# Patient Record
Sex: Male | Born: 1970 | Race: White | Hispanic: No | Marital: Married | Smoking: Never smoker
Health system: Southern US, Community
[De-identification: ages and names within clinical notes are randomized; demographics above are authoritative.]

## PROBLEM LIST (undated history)

## (undated) DIAGNOSIS — M109 Gout, unspecified: Secondary | ICD-10-CM

## (undated) DIAGNOSIS — I10 Essential (primary) hypertension: Secondary | ICD-10-CM

## (undated) DIAGNOSIS — Z87820 Personal history of traumatic brain injury: Secondary | ICD-10-CM

## (undated) HISTORY — PX: CARPAL TUNNEL RELEASE: SHX101

## (undated) HISTORY — PX: GASTRIC BYPASS: SHX52

## (undated) HISTORY — DX: Personal history of traumatic brain injury: Z87.820

## (undated) HISTORY — PX: BACK SURGERY: SHX140

## (undated) HISTORY — PX: KNEE SURGERY: SHX244

---

## 2003-08-20 ENCOUNTER — Encounter: Admission: RE | Admit: 2003-08-20 | Discharge: 2003-08-20 | Payer: Self-pay | Admitting: Family Medicine

## 2003-08-21 ENCOUNTER — Encounter: Admission: RE | Admit: 2003-08-21 | Discharge: 2003-08-21 | Payer: Self-pay | Admitting: Family Medicine

## 2003-12-05 ENCOUNTER — Emergency Department (HOSPITAL_COMMUNITY): Admission: EM | Admit: 2003-12-05 | Discharge: 2003-12-05 | Payer: Self-pay | Admitting: Emergency Medicine

## 2004-05-30 ENCOUNTER — Encounter: Admission: RE | Admit: 2004-05-30 | Discharge: 2004-05-30 | Payer: Self-pay | Admitting: Orthopedic Surgery

## 2013-12-02 ENCOUNTER — Ambulatory Visit: Payer: BC Managed Care – PPO | Admitting: Neurology

## 2014-01-04 ENCOUNTER — Emergency Department (HOSPITAL_COMMUNITY): Payer: BC Managed Care – PPO

## 2014-01-04 ENCOUNTER — Emergency Department (HOSPITAL_COMMUNITY)
Admission: EM | Admit: 2014-01-04 | Discharge: 2014-01-04 | Disposition: A | Discharge status: Home / Self Care | Payer: BC Managed Care – PPO | Attending: Emergency Medicine | Admitting: Emergency Medicine

## 2014-01-04 ENCOUNTER — Encounter (HOSPITAL_COMMUNITY): Payer: Self-pay | Admitting: Emergency Medicine

## 2014-01-04 DIAGNOSIS — I10 Essential (primary) hypertension: Secondary | ICD-10-CM | POA: Insufficient documentation

## 2014-01-04 DIAGNOSIS — F0781 Postconcussional syndrome: Secondary | ICD-10-CM | POA: Insufficient documentation

## 2014-01-04 DIAGNOSIS — Z862 Personal history of diseases of the blood and blood-forming organs and certain disorders involving the immune mechanism: Secondary | ICD-10-CM | POA: Insufficient documentation

## 2014-01-04 DIAGNOSIS — Z8639 Personal history of other endocrine, nutritional and metabolic disease: Secondary | ICD-10-CM | POA: Insufficient documentation

## 2014-01-04 HISTORY — DX: Essential (primary) hypertension: I10

## 2014-01-04 HISTORY — DX: Gout, unspecified: M10.9

## 2014-01-04 MED ORDER — GADOBENATE DIMEGLUMINE 529 MG/ML IV SOLN
20.0000 mL | Freq: Once | INTRAVENOUS | Status: AC | PRN
  Administered 2014-01-04: 20 mL via INTRAVENOUS

## 2014-01-04 MED ORDER — ONDANSETRON HCL 4 MG/2ML IJ SOLN
4.0000 mg | Freq: Once | INTRAMUSCULAR | Status: AC
  Administered 2014-01-04: 4 mg via INTRAVENOUS
  Filled 2014-01-04: qty 2

## 2014-01-04 NOTE — ED Provider Notes (Signed)
CSN: [id number]     Arrival date & time 01/04/14  1146 History   First MD Initiated Contact with Patient 01/04/14 1200     Chief Complaint  Patient presents with  . neurological changes      (Consider location/radiation/quality/duration/timing/severity/associated sxs/prior Treatment) The history is provided by the patient and the spouse.   Pt here with intermittent headaches and trouble speaking since a tbi 8 weeks ago when he was struck in the head with a tree branch--head ct was neg per pt--since then, he has had periods of confusion and ataxia--no syncope or focal deficits--sx persistent and nothing makes them better or worse, no tx used pta--some emesis today without abd pain Past Medical History  Diagnosis Date  . Hypertension   . Gout    Past Surgical History  Procedure Laterality Date  . Gastric bypass    . Back surgery    . Carpal tunnel release    . Knee surgery     Family History  Problem Relation Age of Onset  . Emphysema Father    History  Substance Use Topics  . Smoking status: Never Smoker   . Smokeless tobacco: Never Used  . Alcohol Use: Yes     Comment: socially    Review of Systems  All other systems reviewed and are negative.     Allergies  Review of patient's allergies indicates no known allergies.  Home Medications   Prior to Admission medications   Not on File   BP 160/99  Pulse 100  Temp(Src) 98.1 F (36.7 C) (Oral)  Resp 21  Ht 6\' 6"  (1.981 m)  Wt 207 lb (93.895 kg)  BMI 23.93 kg/m2  SpO2 95% Physical Exam  Nursing note and vitals reviewed. Constitutional: He is oriented to person, place, and time. He appears well-developed and well-nourished.  Non-toxic appearance. No distress.  HENT:  Head: Normocephalic and atraumatic.  Eyes: Conjunctivae, EOM and lids are normal. Pupils are equal, round, and reactive to light.  Neck: Normal range of motion. Neck supple. No tracheal deviation present. No mass present.  Cardiovascular: Normal  rate, regular rhythm and normal heart sounds.  Exam reveals no gallop.   No murmur heard. Pulmonary/Chest: Effort normal and breath sounds normal. No stridor. No respiratory distress. He has no decreased breath sounds. He has no wheezes. He has no rhonchi. He has no rales.  Abdominal: Soft. Normal appearance and bowel sounds are normal. He exhibits no distension. There is no tenderness. There is no rebound and no CVA tenderness.  Musculoskeletal: Normal range of motion. He exhibits no edema and no tenderness.  Neurological: He is alert and oriented to person, place, and time. He has normal strength. No cranial nerve deficit or sensory deficit. GCS eye subscore is 4. GCS verbal subscore is 5. GCS motor subscore is 6.  Skin: Skin is warm and dry. No abrasion and no rash noted.  Psychiatric: He has a normal mood and affect. His speech is normal and behavior is normal.    ED Course  Procedures (including critical care time) Labs Review Labs Reviewed - No data to display  Imaging Review No results found.   EKG Interpretation None      MDM   Final diagnoses:  None    Patient's CT and MR reports were both negative for intracranial hemorrhage or aneurysm. His neurological exam remains stable. He'll be given neurological referral for likely postconcussive syndrome.    Toy BakerAnthony T Tate Zagal, MD 01/04/14 1550

## 2014-01-04 NOTE — ED Notes (Addendum)
Patient and patient's wife report that the patient had a TBI 8 weeks ago. One week ago, the patient had weakness, speech changes, and a fall 3 days from an unsteady gait. Both report that the patient's coordination, speech and gait are much worse. Patient also c/o nausea.

## 2014-01-04 NOTE — Discharge Instructions (Signed)

## 2014-01-04 NOTE — ED Notes (Signed)
Patient transported to MRI 

## 2014-01-04 NOTE — ED Notes (Signed)
MRI called and reports will be here to transport pt to MRI shortly.

## 2014-01-05 ENCOUNTER — Encounter: Payer: Self-pay | Admitting: Neurology

## 2014-01-05 ENCOUNTER — Ambulatory Visit (INDEPENDENT_AMBULATORY_CARE_PROVIDER_SITE_OTHER): Payer: BC Managed Care – PPO | Admitting: Neurology

## 2014-01-05 VITALS — BP 165/103 | HR 73 | Temp 97.8°F | Ht 78.0 in | Wt 233.0 lb

## 2014-01-05 DIAGNOSIS — F419 Anxiety disorder, unspecified: Secondary | ICD-10-CM

## 2014-01-05 DIAGNOSIS — Z87820 Personal history of traumatic brain injury: Secondary | ICD-10-CM

## 2014-01-05 DIAGNOSIS — F8081 Childhood onset fluency disorder: Secondary | ICD-10-CM

## 2014-01-05 DIAGNOSIS — R251 Tremor, unspecified: Secondary | ICD-10-CM

## 2014-01-05 DIAGNOSIS — R259 Unspecified abnormal involuntary movements: Secondary | ICD-10-CM

## 2014-01-05 DIAGNOSIS — F411 Generalized anxiety disorder: Secondary | ICD-10-CM

## 2014-01-05 DIAGNOSIS — F39 Unspecified mood [affective] disorder: Secondary | ICD-10-CM

## 2014-01-05 DIAGNOSIS — R269 Unspecified abnormalities of gait and mobility: Secondary | ICD-10-CM

## 2014-01-05 HISTORY — DX: Personal history of traumatic brain injury: Z87.820

## 2014-01-05 NOTE — Patient Instructions (Signed)
You may have post concussion symptoms: Post-concussion syndrome is a complex disorder which presents with a variety of symptoms, including headache, dizziness, cognitive issues, balance issues, mood related symptoms and motor issues. These can last for weeks and sometimes months after the initial head injury. Concussion is a mild form of traumatic brain injury, and usually occurs after a blow to the head. Loss of consciousness is not a requirement for the diagnosis of concussion or of postconcussion syndrome. The risk of post concussive symptoms does not appear to be correlated with the severity of the initial injury. Most people who have a concussion are without any residual symptoms. Some patients have symptoms that occur within the first 7-10 days of the initial injury and had complete resolution of her symptoms within 3 months. However, keep in mind that postconcussive symptoms can persist for a year and sometimes even longer. There is no specific test for the diagnosis of concussion. There is no specific treatment for concussion or postconcussive symptoms and care is supportive. Treatment is geared towards improving symptoms. Medications commonly used for migraines or tension headaches, including some antidepressants, appear to be helpful with postconcussive headaches. Keep in mind that overuse of over-the-counter pain medications can exacerbate post concussion headaches.    You can try Melatonin at night for sleep: take 3 to 5 mg one hour before your bedtime. Do not use alcohol to help you sleep. In fact, I would recommend you eliminate alcohol altogether, as it can worsen your balance.    There are no medications currently recommended specifically for cognitive complaints after mild traumatic brain injury. Usually, time is the best treatment for postconcussive cognitive issues as most of the cognitive complaints resolve on their own in the first few weeks or months after the injury. Sometimes a  referral to a psychiatrist or psychologist is helpful. Certain forms of cognitive therapy may be helpful, and relaxation therapy may also help.

## 2014-01-05 NOTE — Progress Notes (Signed)
Subjective:    Patient ID: Jeremy Pratt is a 43 y.o. male.  HPI    [name], MD, PhD [facility] [address], [address] [address] [place], [place] [address]  Dear Dr. Freida BusmanAllen,   I saw Jeremy Pratt, upon your kind request in my neurologic clinic today for initial consultation of his history of concussion. The patient is accompanied by his wife today. As you know, Mr. Jeremy Pratt is a very pleasant 43 year old right-handed gentleman with an underlying history of hypertension, gout and obesity, who suffered a concussion some 8 weeks ago when, while cutting a tree his equipment was hit by the tree and flipped over and his head hit the side of the metal cage. He did not have LOC, no severe HA, but had a laceration to the scalp. He went to his PCP about 6 days later and had an MRI brain as well as a CTH, which [facility] [place]. He did not requeire stiches. He has since then fallen 2 times and has hit his head. His major issues in the beginning was insomnia, and agitation. He was given a benzodiazepine prn by his PCP, which helped him sleep. He would wake up in tears and has had anxiety. He drinks alcohol 2 drinks per day and has recently been drinking more to help him sleep. He feels more stressed, anxious, jittery, depression.  He was recently seen in the emergency room on 01/04/2014. He had an MRI and MRA of head which I reviewed: Normal for age MRI appearance of the brain. 2. Negative intracranial MRA except for tortuous vessels, including the left cavernous ICA segment which accounts for the earlier head CT appearance.  His current main complaint is stuttering, trembling, not so much pain, mood irritability, depression tearfulness, problems enunciating, intermittent confusion and problems with his balance.   His Past Medical History Is Significant For: Past Medical History  Diagnosis Date  . Hypertension   . Gout     His Past Surgical History Is  Significant For: Past Surgical History  Procedure Laterality Date  . Gastric bypass    . Back surgery    . Carpal tunnel release    . Knee surgery      His Family History Is Significant For: Family History  Problem Relation Age of Onset  . Emphysema Father     His Social History Is Significant For: History   Social History  . Marital Status: Married    Spouse Name: N/A    Number of Children: N/A  . Years of Education: N/A   Social History Main Topics  . Smoking status: Never Smoker   . Smokeless tobacco: Never Used  . Alcohol Use: Yes     Comment: socially  . Drug Use: No  . Sexual Activity: None   Other Topics Concern  . None   Social History Narrative  . None    His Allergies Are:  No Known Allergies:   His Current Medications Are:  Outpatient Encounter Prescriptions as of 01/05/2014  Medication Sig  . bismuth subsalicylate (PEPTO BISMOL) 262 MG/15ML suspension Take 30 mLs by mouth every 6 (six) hours as needed for indigestion or diarrhea or loose stools.  . calcium carbonate (TUMS - DOSED IN MG ELEMENTAL CALCIUM) 500 MG chewable tablet Chew 2 tablets by mouth 2 (two) times daily.  . Cholecalciferol (VITAMIN D3) 2000 UNITS TABS Take 1 tablet by mouth daily.  . colchicine 0.6 MG tablet Take 0.6  mg by mouth daily.  . ferrous sulfate 325 (65 FE) MG tablet Take 325 mg by mouth daily with breakfast.  . Multiple Vitamins-Minerals (MULTIVITAMIN WITH MINERALS) tablet Take 1 tablet by mouth daily.  . Olmesartan-Amlodipine-HCTZ (TRIBENZOR) 40-10-25 MG TABS Take by mouth.  Marland Kitchen. omeprazole (PRILOSEC) 20 MG capsule Take 20 mg by mouth 2 (two) times daily before a meal.  . promethazine (PHENERGAN) 25 MG tablet Take 1 tablet by mouth as needed.  . psyllium (HYDROCIL/METAMUCIL) 95 % PACK Take 1 packet by mouth daily.  . ursodiol (ACTIGALL) 300 MG capsule Take 300 mg by mouth 2 (two) times daily.  . vitamin B-12 (CYANOCOBALAMIN) 500 MCG tablet Take 500 mcg by mouth daily.  .  diazepam (VALIUM) 10 MG tablet Take 1 tablet by mouth at bedtime as needed and may repeat dose one time if needed.  . [DISCONTINUED] cyanocobalamin 2000 MCG tablet Take 2,000 mcg by mouth daily.   Review of Systems:  Out of a complete 14 point review of systems, all are reviewed and negative with the exception of these symptoms as listed below:  Review of Systems  Constitutional: Positive for fatigue.  HENT: Negative.   Eyes: Negative.   Respiratory: Negative.   Cardiovascular: Negative.   Gastrointestinal: Negative.   Endocrine: Positive for polydipsia.  Genitourinary: Negative.   Musculoskeletal:       Cramps  Skin: Negative.   Allergic/Immunologic: Negative.   Neurological: Positive for dizziness, tremors and speech difficulty.       Memory loss  Hematological: Negative.   Psychiatric/Behavioral: Positive for confusion, sleep disturbance (insomnia, e.d.s.), dysphoric mood and decreased concentration. The patient is nervous/anxious.     Objective:  Neurologic Exam  Physical Exam Physical Examination:   Filed Vitals:   01/05/14 1529  BP: 165/103  Pulse: 73  Temp: 97.8 F (36.6 C)    General Examination: The patient is a very pleasant 43 y.o. male in no acute distress. He appears well-developed and well-nourished and well groomed. He appears anxious.   HEENT: Normocephalic, atraumatic, pupils are equal, round and reactive to light and accommodation. Funduscopic exam is normal with sharp disc margins noted. Extraocular tracking is good without limitation to gaze excursion or nystagmus noted. Normal smooth pursuit is noted. Hearing is grossly intact. Tympanic membranes are clear bilaterally. Face is symmetric with normal facial animation and normal facial sensation. Speech is clear with no dysarthria noted. There is no hypophonia. There is no lip, neck/head, jaw or voice tremor. Neck is supple with full range of passive and active motion. There are no carotid bruits on  auscultation. Oropharynx exam reveals: mild mouth dryness, adequate dental hygiene and mild airway crowding. Mallampati is class II. Tongue protrudes centrally and palate elevates symmetrically.   Chest: Clear to auscultation without wheezing, rhonchi or crackles noted.  Heart: S1+S2+0, regular and normal without murmurs, rubs or gallops noted.   Abdomen: Soft, non-tender and non-distended with normal bowel sounds appreciated on auscultation.  Extremities: There is no pitting edema in the distal lower extremities bilaterally. Pedal pulses are intact.  Skin: Warm and dry without trophic changes noted. There are no varicose veins.  Musculoskeletal: exam reveals no obvious joint deformities, tenderness or joint swelling or erythema.   Neurologically:  Mental status: The patient is awake, alert and oriented in all 4 spheres. His immediate and remote memory, attention, language skills and fund of knowledge are appropriate. There is no evidence of aphasia, agnosia, apraxia or anomia. Speech is clear with normal prosody and enunciation  interspersed with intermittent stuttering. Thought process is linear. Mood is constricted and affect is blunted. His MMSE is 30/30, clock drawing is 4/4, animal fluency is 10 per minute. Cranial nerves II - XII are as described above under HEENT exam. In addition: shoulder shrug is normal with equal shoulder height noted. Motor exam: Normal bulk, strength and tone is noted. There is no drift, rest tremor or rebound. He has a very slight upper extremity postural and action tremor. This is small in amplitude and fast in frequency. Romberg is negative. Reflexes are 2+ throughout. Babinski: Toes are flexor bilaterally. Fine motor skills and coordination: intact with normal finger taps, normal hand movements, normal rapid alternating patting, normal foot taps and normal foot agility.  Cerebellar testing: No dysmetria or intention tremor on finger to nose testing. Heel to shin is  unremarkable bilaterally. There is no truncal or gait ataxia.  Sensory exam: intact to light touch, pinprick, vibration, temperature sense in the upper and lower extremities.  Gait, station and balance: He stands with mild difficulty. No veering to one side is noted. No leaning to one side is noted. Posture is age-appropriate and stance is narrow based. Gait shows insecure gait and cautious gait, he does not have any shuffling or limp. He has problems with tandem walk and is able to eventually do this without help. He has no significant problem turning. He is able to perform toe stance. He has trouble with heel stance.  Assessment and Plan:   In summary, Matther Labell is a very pleasant 43 y.o.-year old male with an underlying history of hypertension, gout and obesity, who suffered a concussion some 8 weeks ago when, while cutting a tree his equipment was hit by the tree and flipped over and his head hit the side of the metal cage. On examination he has mild tremulousness, which appears to be in keeping with enhanced physiological tremor, has a vague, insecure gait, and intermittent stutter which does not typically have a organic neurologic correlate as you know. I think his symptomatology is primarily driven by a heightened sense of anxiety and mood irritability, perhaps frank depression. He also has been suffering from intermittent insomnia. I've advised him to consider taking over-the-counter melatonin for his sleep. Furthermore, I reassured him that his neurological exam is fairly benign. I did ask him to seek consultation for his mood disorder and start by talking to his primary care physician about this. He may benefit from a referral to a psychiatrist or psychologist. Overall, I do believe his symptoms are in the greater realm of postconcussive symptoms but primarily driven by anxiety and depressive symptoms. I explained to him that most patients with postconcussive symptoms get better within the first 3  months. It may take weeks, sometimes months and sometimes a two-year to feel better and everybody is a little different. He also was explained that there is no specific medications typically 4 postconcussion. Symptomatic and supportive care is the focus of treatment. At this juncture, I would not recommend any new medication as severe headaches or not one of his complaints. He is advised to followup with his primary care physician and talked to her about his mood disorder and perhaps trying a medication for anxiety and-or depression. For sleep he can try melatonin as mentioned. He is strongly discouraged from utilizing alcohol as a sleep aid and in fact is advised to discontinue alcohol use altogether at this time because it can worsen his balance and also may interfere with potential medications  for mood disturbance which he may utilize in the near future. He is advised to utilize a cane when he feels more off-balance just for safety. I reviewed his recent MRI brain and MRA head and he has had 2 prior MRIs and also at least 2 CAT scans. On MMSE scores well. Down the road, if need be, he may benefit from a full neuropsychological evaluation and cognitive testing. At this juncture, I suggested I see him back routinely in 3 months, sooner if the need arises.  Thank you very much for allowing me to participate in the care of this nice patient. If I can be of any further assistance to you please do not hesitate to call me at [phone].  Sincerely,   Huston Foley, MD, PhD

## 2014-02-04 ENCOUNTER — Telehealth: Payer: Self-pay | Admitting: *Deleted

## 2014-02-04 NOTE — Telephone Encounter (Signed)
Please ask to have records faxed. I have not seen him for Sz and he is advised to follow instructions from hospital admission or ER visit in the interim.

## 2014-02-04 NOTE — Telephone Encounter (Signed)
Spoke with wife and she said that this was patient's first seizure,[facility] [place] [facility] a [facility], fell and hit head(right side) in same spot as the concussion. He did f/u with his pcp and he suggested that he f/u with his Neurologist. Explained to wife to have pcp send referring notes, and get the [facility] [place] [facility] to [facility] information so that we may give to Dr Frances FurbishAthar for review, she verbalized understanding and said that she will have information faxed

## 2014-02-05 NOTE — Telephone Encounter (Signed)
Spoke to spouse. Advised to follow instructions from hospital admission or ER visit per Dr. Teofilo PodAthar's note. She said they are working on getting the records faxed also.

## 2014-03-14 ENCOUNTER — Encounter (HOSPITAL_COMMUNITY): Payer: Self-pay | Admitting: Emergency Medicine

## 2014-03-14 ENCOUNTER — Emergency Department (HOSPITAL_COMMUNITY)
Admission: EM | Admit: 2014-03-14 | Discharge: 2014-03-15 | Disposition: A | Discharge status: Other Acute Inpatient Hospital | Payer: BC Managed Care – PPO | Attending: Emergency Medicine | Admitting: Emergency Medicine

## 2014-03-14 DIAGNOSIS — F10239 Alcohol dependence with withdrawal, unspecified: Secondary | ICD-10-CM | POA: Diagnosis not present

## 2014-03-14 DIAGNOSIS — Z79899 Other long term (current) drug therapy: Secondary | ICD-10-CM | POA: Insufficient documentation

## 2014-03-14 DIAGNOSIS — F1994 Other psychoactive substance use, unspecified with psychoactive substance-induced mood disorder: Secondary | ICD-10-CM | POA: Insufficient documentation

## 2014-03-14 DIAGNOSIS — Z862 Personal history of diseases of the blood and blood-forming organs and certain disorders involving the immune mechanism: Secondary | ICD-10-CM | POA: Insufficient documentation

## 2014-03-14 DIAGNOSIS — F1310 Sedative, hypnotic or anxiolytic abuse, uncomplicated: Secondary | ICD-10-CM | POA: Insufficient documentation

## 2014-03-14 DIAGNOSIS — Z87828 Personal history of other (healed) physical injury and trauma: Secondary | ICD-10-CM | POA: Diagnosis not present

## 2014-03-14 DIAGNOSIS — F10939 Alcohol use, unspecified with withdrawal, unspecified: Secondary | ICD-10-CM | POA: Diagnosis present

## 2014-03-14 DIAGNOSIS — F1020 Alcohol dependence, uncomplicated: Secondary | ICD-10-CM | POA: Insufficient documentation

## 2014-03-14 DIAGNOSIS — I10 Essential (primary) hypertension: Secondary | ICD-10-CM | POA: Diagnosis not present

## 2014-03-14 DIAGNOSIS — Z8639 Personal history of other endocrine, nutritional and metabolic disease: Secondary | ICD-10-CM | POA: Insufficient documentation

## 2014-03-14 LAB — CBC WITH DIFFERENTIAL/PLATELET
Basophils Absolute: 0 10*3/uL (ref 0.0–0.1)
Basophils Relative: 0 % (ref 0–1)
Eosinophils Absolute: 0 10*3/uL (ref 0.0–0.7)
Eosinophils Relative: 0 % (ref 0–5)
HCT: 43.9 % (ref 39.0–52.0)
Hemoglobin: 15.3 g/dL (ref 13.0–17.0)
Lymphocytes Relative: 29 % (ref 12–46)
Lymphs Abs: 2.1 10*3/uL (ref 0.7–4.0)
MCH: 32.3 pg (ref 26.0–34.0)
MCHC: 34.9 g/dL (ref 30.0–36.0)
MCV: 92.6 fL (ref 78.0–100.0)
Monocytes Absolute: 0.2 10*3/uL (ref 0.1–1.0)
Monocytes Relative: 2 % — ABNORMAL LOW (ref 3–12)
Neutro Abs: 5 10*3/uL (ref 1.7–7.7)
Neutrophils Relative %: 69 % (ref 43–77)
Platelets: 330 10*3/uL (ref 150–400)
RBC: 4.74 MIL/uL (ref 4.22–5.81)
RDW: 12.9 % (ref 11.5–15.5)
WBC: 7.3 10*3/uL (ref 4.0–10.5)

## 2014-03-14 LAB — COMPREHENSIVE METABOLIC PANEL
ALT: 84 U/L — ABNORMAL HIGH (ref 0–53)
AST: 72 U/L — ABNORMAL HIGH (ref 0–37)
Albumin: 3.1 g/dL — ABNORMAL LOW (ref 3.5–5.2)
Alkaline Phosphatase: 145 U/L — ABNORMAL HIGH (ref 39–117)
Anion gap: 16 — ABNORMAL HIGH (ref 5–15)
BUN: 7 mg/dL (ref 6–23)
CO2: 23 mEq/L (ref 19–32)
Calcium: 7.9 mg/dL — ABNORMAL LOW (ref 8.4–10.5)
Chloride: 106 mEq/L (ref 96–112)
Creatinine, Ser: 0.75 mg/dL (ref 0.50–1.35)
GFR calc Af Amer: >90 mL/min (ref >90)
GFR calc non Af Amer: >90 mL/min (ref >90)
Glucose, Bld: 162 mg/dL — ABNORMAL HIGH (ref 70–99)
Potassium: 3.7 mEq/L (ref 3.7–5.3)
Sodium: 145 mEq/L (ref 137–147)
Total Bilirubin: 0.4 mg/dL (ref 0.3–1.2)
Total Protein: 6.5 g/dL (ref 6.0–8.3)

## 2014-03-14 LAB — ETHANOL: Alcohol, Ethyl (B): 362 mg/dL — ABNORMAL HIGH (ref 0–11)

## 2014-03-14 MED ORDER — LORAZEPAM 1 MG PO TABS: 0.0000 mg | ORAL_TABLET | Freq: Two times a day (BID) | ORAL | Status: DC

## 2014-03-14 MED ORDER — LORAZEPAM 1 MG PO TABS
0.0000 mg | ORAL_TABLET | Freq: Four times a day (QID) | ORAL | Status: DC
Start: 2014-03-15 — End: 2014-03-15
  Administered 2014-03-15 (×2): 1 mg via ORAL
  Filled 2014-03-14 (×2): qty 1

## 2014-03-14 NOTE — ED Notes (Signed)
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

## 2014-03-14 NOTE — ED Provider Notes (Signed)
CSN: [id number]     Arrival date & time 03/14/14  2136 History  This chart was scribed for non-physician practitioner, Earley Favor, FNP working with Hurman Horn, MD by Greggory Stallion, ED scribe. This patient was seen in room WTR4/WLPT4 and the patient's care was started at 10:05 PM.   Chief Complaint  Patient presents with  . Alcohol Problem   The history is provided by the patient and the spouse. No language interpreter was used.   HPI Comments: Jeremy Pratt is a 43 y.o. male who presents to the Emergency Department requesting alcohol detox. His last drink was earlier today and was about half of a 1/5. States he drinks anything he can but normally has liquor. He has been drinking on a regular basis for the last 6 months since a concussion. Pt went to behavioral health 3 days ago. States they wanted him to come to the ED for medical clearance. He states that he waited because he had work to do. Denies SI/HI. Wife states that he sometimes goes 5-10 days without drinking but goes through withdrawal symptoms. States he has had a seizure due to withdrawing. Denies other history of seizures. Pt states he is not sure why he starts drinking after the few days that he stops. Wife is unsure if the drinking is due to the concussion that he had. Pt has seen two neurologists but told to follow up in 2-4 months. He is waiting to hear from a new neurologist.  Patient has been having frequent falls and balance has been off since his head injury 6 months ago   Past Medical History  Diagnosis Date  . Hypertension   . Gout   . History of concussion 01/05/2014   Past Surgical History  Procedure Laterality Date  . Gastric bypass    . Back surgery    . Carpal tunnel release    . Knee surgery     Family History  Problem Relation Age of Onset  . Emphysema Father    History  Substance Use Topics  . Smoking status: Never Smoker   . Smokeless tobacco: Never Used  . Alcohol Use: Yes     Comment: socially     Review of Systems  Psychiatric/Behavioral: Negative for suicidal ideas.  All other systems reviewed and are negative.  Allergies  Review of patient's allergies indicates no known allergies.  Home Medications   Prior to Admission medications   Medication Sig Start Date End Date Taking? Authorizing Provider  bismuth subsalicylate (PEPTO BISMOL) 262 MG/15ML suspension Take 30 mLs by mouth every 6 (six) hours as needed for indigestion or diarrhea or loose stools.   Yes Historical Provider, MD  calcium carbonate (TUMS - DOSED IN MG ELEMENTAL CALCIUM) 500 MG chewable tablet Chew 2 tablets by mouth 2 (two) times daily.   Yes Historical Provider, MD  Cholecalciferol (VITAMIN D3) 2000 UNITS TABS Take 1 tablet by mouth daily.   Yes Historical Provider, MD  colchicine 0.6 MG tablet Take 0.6 mg by mouth daily.   Yes Historical Provider, MD  ferrous sulfate 325 (65 FE) MG tablet Take 325 mg by mouth daily with breakfast.   Yes Historical Provider, MD  hydrOXYzine (VISTARIL) 50 MG capsule Take 1 capsule by mouth 2 (two) times daily as needed. anxiety 03/02/14  Yes Historical Provider, MD  Multiple Vitamins-Minerals (MULTIVITAMIN WITH MINERALS) tablet Take 1 tablet by mouth daily.   Yes Historical Provider, MD  Olmesartan-Amlodipine-HCTZ (TRIBENZOR) 40-5-12.5 MG TABS Take 1 tablet by  mouth every morning.   Yes Historical Provider, MD  omeprazole (PRILOSEC) 20 MG capsule Take 20 mg by mouth 2 (two) times daily before a meal.   Yes Historical Provider, MD  PARoxetine (PAXIL) 20 MG tablet Take 1 tablet by mouth 2 (two) times daily. 03/02/14  Yes Historical Provider, MD  psyllium (HYDROCIL/METAMUCIL) 95 % PACK Take 1 packet by mouth daily.   Yes Historical Provider, MD  ursodiol (ACTIGALL) 300 MG capsule Take 300 mg by mouth 2 (two) times daily.   Yes Historical Provider, MD  vitamin B-12 (CYANOCOBALAMIN) 500 MCG tablet Take 500 mcg by mouth daily.   Yes Historical Provider, MD   BP 158/83  Pulse 62   Temp(Src) 97.8 F (36.6 C) (Oral)  Resp 18  Wt 227 lb (102.967 kg)  SpO2 98%  Physical Exam  Nursing note and vitals reviewed. Constitutional: He is oriented to person, place, and time. He appears well-developed and well-nourished. No distress.  HENT:  Head: Normocephalic and atraumatic.  Eyes: Conjunctivae and EOM are normal.  Neck: Neck supple. No tracheal deviation present.  Cardiovascular: Normal rate.   Pulmonary/Chest: Effort normal. No respiratory distress.  Musculoskeletal: Normal range of motion.  Neurological: He is alert and oriented to person, place, and time.  Skin: Skin is warm and dry.  Psychiatric: He has a normal mood and affect. His behavior is normal. He expresses no homicidal and no suicidal ideation. He expresses no suicidal plans and no homicidal plans.    ED Course  Procedures (including critical care time)  DIAGNOSTIC STUDIES: Oxygen Saturation is 95% on RA, adequate by my interpretation.    COORDINATION OF CARE: 10:11 PM-Discussed treatment plan which includes lab work with pt at bedside and pt agreed to plan.   Labs Review Labs Reviewed  CBC WITH DIFFERENTIAL - Abnormal; Notable for the following:    Monocytes Relative 2 (*)    All other components within normal limits  COMPREHENSIVE METABOLIC PANEL - Abnormal; Notable for the following:    Glucose, Bld 162 (*)    Calcium 7.9 (*)    Albumin 3.1 (*)    AST 72 (*)    ALT 84 (*)    Alkaline Phosphatase 145 (*)    Anion gap 16 (*)    All other components within normal limits  ETHANOL - Abnormal; Notable for the following:    Alcohol, Ethyl (B) 362 (*)    All other components within normal limits  URINE RAPID DRUG SCREEN (HOSP PERFORMED) - Abnormal; Notable for the following:    Benzodiazepines POSITIVE (*)    All other components within normal limits    Imaging Review No results found.   EKG Interpretation None      MDM   Final diagnoses:  Alcohol dependence with withdrawal with  complication  Substance induced mood disorder   Will review labs and have TTS evaluation due to patients Hx of withdrawal seizures     I personally performed the services described in this documentation, which was scribed in my presence. The recorded information has been reviewed and is accurate.  Arman Filter, NP 03/14/14 2216  Arman Filter, NP 03/17/14 0454

## 2014-03-15 ENCOUNTER — Encounter (HOSPITAL_COMMUNITY): Payer: Self-pay

## 2014-03-15 ENCOUNTER — Inpatient Hospital Stay (HOSPITAL_COMMUNITY)
Admission: AD | Admit: 2014-03-15 | Discharge: 2014-03-18 | DRG: 897 | Disposition: A | Discharge status: Home / Self Care | Payer: BC Managed Care – PPO | Source: Intra-hospital | Attending: Psychiatry | Admitting: Psychiatry

## 2014-03-15 ENCOUNTER — Encounter (HOSPITAL_COMMUNITY): Payer: Self-pay | Admitting: Psychiatry

## 2014-03-15 DIAGNOSIS — F10280 Alcohol dependence with alcohol-induced anxiety disorder: Secondary | ICD-10-CM

## 2014-03-15 DIAGNOSIS — F411 Generalized anxiety disorder: Secondary | ICD-10-CM | POA: Diagnosis present

## 2014-03-15 DIAGNOSIS — M109 Gout, unspecified: Secondary | ICD-10-CM | POA: Diagnosis present

## 2014-03-15 DIAGNOSIS — F1020 Alcohol dependence, uncomplicated: Principal | ICD-10-CM | POA: Diagnosis present

## 2014-03-15 DIAGNOSIS — I10 Essential (primary) hypertension: Secondary | ICD-10-CM | POA: Diagnosis present

## 2014-03-15 DIAGNOSIS — F10239 Alcohol dependence with withdrawal, unspecified: Secondary | ICD-10-CM | POA: Diagnosis present

## 2014-03-15 DIAGNOSIS — F10939 Alcohol use, unspecified with withdrawal, unspecified: Secondary | ICD-10-CM | POA: Diagnosis not present

## 2014-03-15 DIAGNOSIS — F1994 Other psychoactive substance use, unspecified with psychoactive substance-induced mood disorder: Secondary | ICD-10-CM

## 2014-03-15 DIAGNOSIS — Z609 Problem related to social environment, unspecified: Secondary | ICD-10-CM

## 2014-03-15 DIAGNOSIS — F329 Major depressive disorder, single episode, unspecified: Secondary | ICD-10-CM | POA: Diagnosis present

## 2014-03-15 DIAGNOSIS — F1010 Alcohol abuse, uncomplicated: Secondary | ICD-10-CM

## 2014-03-15 LAB — RAPID URINE DRUG SCREEN, HOSP PERFORMED
Amphetamines: NOT DETECTED
Barbiturates: NOT DETECTED
Benzodiazepines: POSITIVE — AB
Cocaine: NOT DETECTED
Opiates: NOT DETECTED
Tetrahydrocannabinol: NOT DETECTED

## 2014-03-15 MED ORDER — LORAZEPAM 1 MG PO TABS
1.0000 mg | ORAL_TABLET | Freq: Four times a day (QID) | ORAL | Status: DC | PRN
  Administered 2014-03-15: 1 mg via ORAL
  Filled 2014-03-15: qty 1

## 2014-03-15 MED ORDER — POTASSIUM CHLORIDE CRYS ER 10 MEQ PO TBCR
10.0000 meq | EXTENDED_RELEASE_TABLET | Freq: Every day | ORAL | Status: DC
Start: 2014-03-15 — End: 2014-03-15
  Filled 2014-03-15: qty 0.5

## 2014-03-15 MED ORDER — MAGNESIUM HYDROXIDE 400 MG/5ML PO SUSP: 30.0000 mL | Freq: Every day | ORAL | Status: DC | PRN

## 2014-03-15 MED ORDER — LORAZEPAM 1 MG PO TABS
1.0000 mg | ORAL_TABLET | Freq: Four times a day (QID) | ORAL | Status: DC | PRN
  Filled 2014-03-15: qty 1

## 2014-03-15 MED ORDER — THIAMINE HCL 100 MG/ML IJ SOLN: 100.0000 mg | Freq: Every day | INTRAMUSCULAR | Status: DC

## 2014-03-15 MED ORDER — CLONIDINE HCL 0.1 MG PO TABS
0.1000 mg | ORAL_TABLET | Freq: Three times a day (TID) | ORAL | Status: DC
  Administered 2014-03-15: 0.1 mg via ORAL
  Filled 2014-03-15: qty 1

## 2014-03-15 MED ORDER — LORAZEPAM 2 MG/ML IJ SOLN: 1.0000 mg | Freq: Four times a day (QID) | INTRAMUSCULAR | Status: DC | PRN

## 2014-03-15 MED ORDER — CLONIDINE HCL 0.1 MG PO TABS: 0.1000 mg | ORAL_TABLET | Freq: Three times a day (TID) | ORAL | Status: DC | PRN

## 2014-03-15 MED ORDER — AMLODIPINE BESYLATE 5 MG PO TABS
5.0000 mg | ORAL_TABLET | Freq: Every day | ORAL | Status: DC
  Administered 2014-03-15: 5 mg via ORAL
  Filled 2014-03-15: qty 1

## 2014-03-15 MED ORDER — FOLIC ACID 1 MG PO TABS
1.0000 mg | ORAL_TABLET | Freq: Every day | ORAL | Status: DC
  Administered 2014-03-16 – 2014-03-18 (×3): 1 mg via ORAL
  Filled 2014-03-15 (×5): qty 1

## 2014-03-15 MED ORDER — LORAZEPAM 1 MG PO TABS
0.0000 mg | ORAL_TABLET | Freq: Four times a day (QID) | ORAL | Status: DC
  Administered 2014-03-15 – 2014-03-16 (×2): 1 mg via ORAL
  Filled 2014-03-15 (×2): qty 1

## 2014-03-15 MED ORDER — ADULT MULTIVITAMIN W/MINERALS CH
1.0000 | ORAL_TABLET | Freq: Every day | ORAL | Status: DC
  Administered 2014-03-15: 1 via ORAL
  Filled 2014-03-15: qty 1

## 2014-03-15 MED ORDER — POTASSIUM CHLORIDE CRYS ER 10 MEQ PO TBCR
10.0000 meq | EXTENDED_RELEASE_TABLET | Freq: Every day | ORAL | Status: DC
  Administered 2014-03-16 – 2014-03-18 (×3): 10 meq via ORAL
  Filled 2014-03-15 (×6): qty 1

## 2014-03-15 MED ORDER — LORAZEPAM 1 MG PO TABS: 0.0000 mg | ORAL_TABLET | Freq: Two times a day (BID) | ORAL | Status: DC

## 2014-03-15 MED ORDER — ALUM & MAG HYDROXIDE-SIMETH 200-200-20 MG/5ML PO SUSP: 30.0000 mL | ORAL | Status: DC | PRN

## 2014-03-15 MED ORDER — LORAZEPAM 1 MG PO TABS: 1.0000 mg | ORAL_TABLET | Freq: Four times a day (QID) | ORAL | Status: DC | PRN

## 2014-03-15 MED ORDER — URSODIOL 300 MG PO CAPS
300.0000 mg | ORAL_CAPSULE | Freq: Two times a day (BID) | ORAL | Status: DC
  Administered 2014-03-16 – 2014-03-18 (×4): 300 mg via ORAL
  Filled 2014-03-15 (×9): qty 1

## 2014-03-15 MED ORDER — ACETAMINOPHEN 325 MG PO TABS: 650.0000 mg | ORAL_TABLET | Freq: Four times a day (QID) | ORAL | Status: DC | PRN

## 2014-03-15 MED ORDER — ADULT MULTIVITAMIN W/MINERALS CH
1.0000 | ORAL_TABLET | Freq: Every day | ORAL | Status: DC
  Administered 2014-03-16 – 2014-03-18 (×3): 1 via ORAL
  Filled 2014-03-15 (×5): qty 1

## 2014-03-15 MED ORDER — VITAMIN D3 25 MCG (1000 UNIT) PO TABS
2000.0000 [IU] | ORAL_TABLET | Freq: Every day | ORAL | Status: DC
  Administered 2014-03-15: 2000 [IU] via ORAL
  Filled 2014-03-15: qty 2

## 2014-03-15 MED ORDER — PANTOPRAZOLE SODIUM 40 MG PO TBEC: 40.0000 mg | DELAYED_RELEASE_TABLET | Freq: Two times a day (BID) | ORAL | Status: DC

## 2014-03-15 MED ORDER — LORAZEPAM 1 MG PO TABS
0.0000 mg | ORAL_TABLET | Freq: Four times a day (QID) | ORAL | Status: DC
  Administered 2014-03-15: 1 mg via ORAL
  Filled 2014-03-15: qty 1

## 2014-03-15 MED ORDER — FOLIC ACID 1 MG PO TABS
1.0000 mg | ORAL_TABLET | Freq: Every day | ORAL | Status: DC
  Administered 2014-03-15: 1 mg via ORAL
  Filled 2014-03-15: qty 1

## 2014-03-15 MED ORDER — PANTOPRAZOLE SODIUM 40 MG PO TBEC
40.0000 mg | DELAYED_RELEASE_TABLET | Freq: Two times a day (BID) | ORAL | Status: DC
  Administered 2014-03-16 – 2014-03-18 (×4): 40 mg via ORAL
  Filled 2014-03-15 (×11): qty 1

## 2014-03-15 MED ORDER — IRBESARTAN 300 MG PO TABS
300.0000 mg | ORAL_TABLET | Freq: Every day | ORAL | Status: DC
  Administered 2014-03-16 – 2014-03-18 (×3): 300 mg via ORAL
  Filled 2014-03-15 (×5): qty 1

## 2014-03-15 MED ORDER — URSODIOL 300 MG PO CAPS
300.0000 mg | ORAL_CAPSULE | Freq: Two times a day (BID) | ORAL | Status: DC
  Administered 2014-03-15: 300 mg via ORAL
  Filled 2014-03-15 (×2): qty 1

## 2014-03-15 MED ORDER — VITAMIN B-1 100 MG PO TABS
100.0000 mg | ORAL_TABLET | Freq: Every day | ORAL | Status: DC
  Administered 2014-03-16 – 2014-03-18 (×3): 100 mg via ORAL
  Filled 2014-03-15 (×5): qty 1

## 2014-03-15 MED ORDER — CLONIDINE HCL 0.1 MG PO TABS
0.1000 mg | ORAL_TABLET | Freq: Three times a day (TID) | ORAL | Status: DC | PRN
  Administered 2014-03-15: 0.1 mg via ORAL
  Filled 2014-03-15: qty 1

## 2014-03-15 MED ORDER — CYANOCOBALAMIN 500 MCG PO TABS
500.0000 ug | ORAL_TABLET | Freq: Every day | ORAL | Status: DC
  Administered 2014-03-15: 500 ug via ORAL
  Filled 2014-03-15: qty 1

## 2014-03-15 MED ORDER — HYDROCHLOROTHIAZIDE 12.5 MG PO CAPS
12.5000 mg | ORAL_CAPSULE | Freq: Every day | ORAL | Status: DC
  Administered 2014-03-16 – 2014-03-18 (×3): 12.5 mg via ORAL
  Filled 2014-03-15 (×5): qty 1

## 2014-03-15 MED ORDER — IRBESARTAN 300 MG PO TABS
300.0000 mg | ORAL_TABLET | Freq: Every day | ORAL | Status: DC
  Administered 2014-03-15: 300 mg via ORAL
  Filled 2014-03-15: qty 1

## 2014-03-15 MED ORDER — AMLODIPINE BESYLATE 5 MG PO TABS
5.0000 mg | ORAL_TABLET | Freq: Every day | ORAL | Status: DC
  Administered 2014-03-16 – 2014-03-18 (×3): 5 mg via ORAL
  Filled 2014-03-15 (×5): qty 1

## 2014-03-15 MED ORDER — VITAMIN D3 25 MCG (1000 UNIT) PO TABS
2000.0000 [IU] | ORAL_TABLET | Freq: Every day | ORAL | Status: DC
  Administered 2014-03-16 – 2014-03-18 (×3): 2000 [IU] via ORAL
  Filled 2014-03-15 (×5): qty 2

## 2014-03-15 MED ORDER — VITAMIN B-1 100 MG PO TABS
100.0000 mg | ORAL_TABLET | Freq: Every day | ORAL | Status: DC
Start: 2014-03-15 — End: 2014-03-15
  Administered 2014-03-15: 100 mg via ORAL
  Filled 2014-03-15: qty 1

## 2014-03-15 MED ORDER — HYDROXYZINE HCL 25 MG PO TABS
25.0000 mg | ORAL_TABLET | Freq: Four times a day (QID) | ORAL | Status: DC | PRN
  Administered 2014-03-15 – 2014-03-17 (×4): 25 mg via ORAL
  Filled 2014-03-15 (×3): qty 1

## 2014-03-15 MED ORDER — LORAZEPAM 2 MG/ML IJ SOLN
1.0000 mg | Freq: Four times a day (QID) | INTRAMUSCULAR | Status: DC | PRN
Start: 2014-03-15 — End: 2014-03-15

## 2014-03-15 MED ORDER — URSODIOL 300 MG PO CAPS: 300.0000 mg | ORAL_CAPSULE | Freq: Every day | ORAL | Status: DC

## 2014-03-15 MED ORDER — HYDROXYZINE HCL 25 MG PO TABS: 25.0000 mg | ORAL_TABLET | Freq: Four times a day (QID) | ORAL | Status: DC | PRN

## 2014-03-15 MED ORDER — HYDROCHLOROTHIAZIDE 12.5 MG PO CAPS
12.5000 mg | ORAL_CAPSULE | Freq: Every day | ORAL | Status: DC
  Administered 2014-03-15: 12.5 mg via ORAL
  Filled 2014-03-15: qty 1

## 2014-03-15 MED ORDER — CYANOCOBALAMIN 500 MCG PO TABS
500.0000 ug | ORAL_TABLET | Freq: Every day | ORAL | Status: DC
  Administered 2014-03-16 – 2014-03-18 (×3): 500 ug via ORAL
  Filled 2014-03-15 (×5): qty 1

## 2014-03-15 NOTE — ED Notes (Signed)
Report called to Roswell Miners, RN at [facility].

## 2014-03-15 NOTE — Progress Notes (Signed)
D:Patient in the hallway on approach.  Patient states he has been having alcohol withdrawal.  Patient states he wants to get the symptoms and tremors under control.  Patient states he has ben having difficulty with unsteadiness.  Patient states he will be careful and patient was made a fall risk.  Patient denies SI/HI and AVH. A: Staff to monitor Q 15 mins for safety.  Encouragement and support offered.  Scheduled medications administered per orders. R: Patient remains safe on the unit.  Patient attended group tonight.  Patient visible on the unit and interacting with peers.  Patient taking administered medications.

## 2014-03-15 NOTE — Tx Team (Signed)
Initial Interdisciplinary Treatment Plan   PATIENT STRESSORS: Substance abuse   PROBLEM LIST: Problem List/Patient Goals Date to be addressed Date deferred Reason deferred Estimated date of resolution  depression 03/15/14     Alcohol dependence 03/15/14                                                DISCHARGE CRITERIA:  Ability to meet basic life and health needs Adequate post-discharge living arrangements Improved stabilization in mood, thinking, and/or behavior Medical problems require only outpatient monitoring Motivation to continue treatment in a less acute level of care Need for constant or close observation no longer present Reduction of life-threatening or endangering symptoms to within safe limits  PRELIMINARY DISCHARGE PLAN: Attend 12-step recovery group Outpatient therapy Return to previous living arrangement Return to previous work or school arrangements  PATIENT/FAMIILY INVOLVEMENT: This treatment plan has been presented to and reviewed with the patient, Jeremy Pratt, and/or family member.  The patient and family have been given the opportunity to ask questions and make suggestions.  Billy Coast 03/15/2014, 6:23 PM

## 2014-03-15 NOTE — Consult Note (Signed)
[facility] Face-to-Face Psychiatry Consult   Reason for Consult:  Alcohol dependence/detox Referring Physician:  EDP Jeremy Pratt is an 43 y.o. male. Total Time spent with patient: 20 minutes  Assessment: AXIS I:  Alcohol Abuse and Substance Induced Mood Disorder AXIS II:  Deferred AXIS III:   Past Medical History  Diagnosis Date  . Hypertension   . Gout   . History of concussion 01/05/2014   AXIS IV:  other psychosocial or environmental problems, problems related to social environment and problems with primary support group AXIS V:  35; serious symptoms  Plan:  Transfer to [facility] unit for stabilization.  Dr. Adele Schilder assessed the patient and concurs with the plan.  Subjective:   Jeremy Pratt is a 43 y.o. male patient admitted with alcohol detox/dependence  HPI:  Patient had a concussion about six months ago and his "thought processes have changed".  He has been self medicating with alcohol to sleep.  Ten days ago he stopped drinking on his own and had a seizure, the first ever.  He was doing well until her relapsed Thursday night and went to [facility] IOP.  Saturday mid-day was his last drinking, tremulous on assessment, elevated blood pressures, and reddened face.  He lives with a supportive wife and 2 children.  Denies suicidal/homicidal ideations, hallucinations, and drug abuse.  HPI Elements:   Location:  generalized. Quality:  acute. Severity:  moderate to severe. Timing:  constant. Duration:  6 months. Context:  concussion, stressors.  Past Psychiatric History: Past Medical History  Diagnosis Date  . Hypertension   . Gout   . History of concussion 01/05/2014    reports that he has never smoked. He has never used smokeless tobacco. He reports that he drinks alcohol. He reports that he does not use illicit drugs. Family History  Problem Relation Age of Onset  . Emphysema Father    Family History Substance Abuse: Yes, Describe: (Family) Family Supports: Yes, List: (Wife ) Living  Arrangements: Spouse/significant other;Children Can pt return to current living arrangement?: Yes Abuse/Neglect [facility]) Physical Abuse: Denies Verbal Abuse: Denies Sexual Abuse: Denies Allergies:  No Known Allergies  ACT Assessment Complete:  Yes:    Educational Status    Risk to Self: Risk to self with the past 6 months Suicidal Ideation: No Suicidal Intent: No Is patient at risk for suicide?: No Suicidal Plan?: No Access to Means: No What has been your use of drugs/alcohol within the last 12 months?: Alcohol use daily Previous Attempts/Gestures: No How many times?: 0 Other Self Harm Risks: No other self harm risk identified at this time Triggers for Past Attempts: None known Intentional Self Injurious Behavior: None Family Suicide History: No Recent stressful life event(s):  (Work ) Persecutory voices/beliefs?: No Depression: Yes Depression Symptoms: Tearfulness;Guilt;Loss of interest in usual pleasures;Feeling angry/irritable Substance abuse history and/or treatment for substance abuse?: Yes Suicide prevention information given to non-admitted patients: Not applicable  Risk to Others: Risk to Others within the past 6 months Homicidal Ideation: No Thoughts of Harm to Others: No Current Homicidal Intent: No Current Homicidal Plan: No Access to Homicidal Means: No Identified Victim: NA History of harm to others?: No Assessment of Violence: None Noted Violent Behavior Description: No violent behaviors observed. Pt is calm and cooperative at this time. Does patient have access to weapons?: Yes (Comment) (Guns) Criminal Charges Pending?: No Does patient have a court date: No  Abuse: Abuse/Neglect Assessment (Assessment to be complete while patient is alone) Physical Abuse: Denies Verbal Abuse: Denies Sexual  Abuse: Denies Exploitation of patient/patient's resources: Denies Self-Neglect: Denies  Prior Inpatient Therapy: Prior Inpatient Therapy Prior Inpatient Therapy: No   Prior Outpatient Therapy: Prior Outpatient Therapy Prior Outpatient Therapy: No  Additional Information: Additional Information 1:1 In Past 12 Months?: No CIRT Risk: No Elopement Risk: No Does patient have medical clearance?: Yes                  Objective: Blood pressure 153/106, pulse 76, temperature 97 F (36.1 C), temperature source Oral, resp. rate 20, weight 227 lb (102.967 kg), SpO2 100.00%.Body mass index is 26.24 kg/(m^2). Results for orders placed during the hospital encounter of 03/14/14 (from the past 72 hour(s))  CBC WITH DIFFERENTIAL     Status: Abnormal   Collection Time    03/14/14 10:25 PM      Result Value Ref Range   WBC 7.3  4.0 - 10.5 K/uL   RBC 4.74  4.22 - 5.81 MIL/uL   Hemoglobin 15.3  13.0 - 17.0 g/dL   HCT 43.9  39.0 - 52.0 %   MCV 92.6  78.0 - 100.0 fL   MCH 32.3  26.0 - 34.0 pg   MCHC 34.9  30.0 - 36.0 g/dL   RDW 12.9  11.5 - 15.5 %   Platelets 330  150 - 400 K/uL   Neutrophils Relative % 69  43 - 77 %   Neutro Abs 5.0  1.7 - 7.7 K/uL   Lymphocytes Relative 29  12 - 46 %   Lymphs Abs 2.1  0.7 - 4.0 K/uL   Monocytes Relative 2 (*) 3 - 12 %   Monocytes Absolute 0.2  0.1 - 1.0 K/uL   Eosinophils Relative 0  0 - 5 %   Eosinophils Absolute 0.0  0.0 - 0.7 K/uL   Basophils Relative 0  0 - 1 %   Basophils Absolute 0.0  0.0 - 0.1 K/uL  COMPREHENSIVE METABOLIC PANEL     Status: Abnormal   Collection Time    03/14/14 10:25 PM      Result Value Ref Range   Sodium 145  137 - 147 mEq/L   Potassium 3.7  3.7 - 5.3 mEq/L   Chloride 106  96 - 112 mEq/L   CO2 23  19 - 32 mEq/L   Glucose, Bld 162 (*) 70 - 99 mg/dL   BUN 7  6 - 23 mg/dL   Creatinine, Ser 0.75  0.50 - 1.35 mg/dL   Calcium 7.9 (*) 8.4 - 10.5 mg/dL   Total Protein 6.5  6.0 - 8.3 g/dL   Albumin 3.1 (*) 3.5 - 5.2 g/dL   AST 72 (*) 0 - 37 U/L   ALT 84 (*) 0 - 53 U/L   Alkaline Phosphatase 145 (*) 39 - 117 U/L   Total Bilirubin 0.4  0.3 - 1.2 mg/dL   GFR calc non Af Amer >90  >90  mL/min   GFR calc Af Amer >90  >90 mL/min   Comment: (NOTE)     The eGFR has been calculated using the CKD EPI equation.     This calculation has not been validated in all clinical situations.     eGFR's persistently <90 mL/min signify possible Chronic Kidney     Disease.   Anion gap 16 (*) 5 - 15  ETHANOL     Status: Abnormal   Collection Time    03/14/14 10:25 PM      Result Value Ref Range   Alcohol, Ethyl (B) 362 (*)  0 - 11 mg/dL   Comment:            LOWEST DETECTABLE LIMIT FOR     SERUM ALCOHOL IS 11 mg/dL     FOR MEDICAL PURPOSES ONLY  URINE RAPID DRUG SCREEN (HOSP PERFORMED)     Status: Abnormal   Collection Time    03/15/14  1:03 AM      Result Value Ref Range   Opiates NONE DETECTED  NONE DETECTED   Cocaine NONE DETECTED  NONE DETECTED   Benzodiazepines POSITIVE (*) NONE DETECTED   Amphetamines NONE DETECTED  NONE DETECTED   Tetrahydrocannabinol NONE DETECTED  NONE DETECTED   Barbiturates NONE DETECTED  NONE DETECTED   Comment:            DRUG SCREEN FOR MEDICAL PURPOSES     ONLY.  IF CONFIRMATION IS NEEDED     FOR ANY PURPOSE, NOTIFY LAB     WITHIN 5 DAYS.                LOWEST DETECTABLE LIMITS     FOR URINE DRUG SCREEN     Drug Class       Cutoff (ng/mL)     Amphetamine      1000     Barbiturate      200     Benzodiazepine   277     Tricyclics       824     Opiates          300     Cocaine          300     THC              50   Labs are reviewed and are pertinent for no medical issues needing attention.  Current Facility-Administered Medications  Medication Dose Route Frequency Provider Last Rate Last Dose  . cloNIDine (CATAPRES) tablet 0.1 mg  0.1 mg Oral TID Waylan Boga, NP      . folic acid (FOLVITE) tablet 1 mg  1 mg Oral Daily Waylan Boga, NP      . LORazepam (ATIVAN) tablet 1 mg  1 mg Oral Q6H PRN Waylan Boga, NP       Or  . LORazepam (ATIVAN) injection 1 mg  1 mg Intravenous Q6H PRN Waylan Boga, NP      . LORazepam (ATIVAN) tablet 0-4 mg   0-4 mg Oral Q6H Waylan Boga, NP       Followed by  . [START ON 03/17/2014] LORazepam (ATIVAN) tablet 0-4 mg  0-4 mg Oral Q12H Waylan Boga, NP      . multivitamin with minerals tablet 1 tablet  1 tablet Oral Daily Waylan Boga, NP      . thiamine (VITAMIN B-1) tablet 100 mg  100 mg Oral Daily Waylan Boga, NP       Or  . thiamine (B-1) injection 100 mg  100 mg Intravenous Daily Waylan Boga, NP       Current Outpatient Prescriptions  Medication Sig Dispense Refill  . bismuth subsalicylate (PEPTO BISMOL) 262 MG/15ML suspension Take 30 mLs by mouth every 6 (six) hours as needed for indigestion or diarrhea or loose stools.      . calcium carbonate (TUMS - DOSED IN MG ELEMENTAL CALCIUM) 500 MG chewable tablet Chew 2 tablets by mouth 2 (two) times daily.      . Cholecalciferol (VITAMIN D3) 2000 UNITS TABS Take 1 tablet by mouth daily.      Marland Kitchen  colchicine 0.6 MG tablet Take 0.6 mg by mouth daily.      . ferrous sulfate 325 (65 FE) MG tablet Take 325 mg by mouth daily with breakfast.      . hydrOXYzine (VISTARIL) 50 MG capsule Take 1 capsule by mouth 2 (two) times daily as needed. anxiety      . Multiple Vitamins-Minerals (MULTIVITAMIN WITH MINERALS) tablet Take 1 tablet by mouth daily.      . Olmesartan-Amlodipine-HCTZ (TRIBENZOR) 40-5-12.5 MG TABS Take 1 tablet by mouth every morning.      Marland Kitchen omeprazole (PRILOSEC) 20 MG capsule Take 20 mg by mouth 2 (two) times daily before a meal.      . PARoxetine (PAXIL) 20 MG tablet Take 1 tablet by mouth 2 (two) times daily.      . psyllium (HYDROCIL/METAMUCIL) 95 % PACK Take 1 packet by mouth daily.      . ursodiol (ACTIGALL) 300 MG capsule Take 300 mg by mouth 2 (two) times daily.      . vitamin B-12 (CYANOCOBALAMIN) 500 MCG tablet Take 500 mcg by mouth daily.        Psychiatric Specialty Exam:     Blood pressure 153/106, pulse 76, temperature 97 F (36.1 C), temperature source Oral, resp. rate 20, weight 227 lb (102.967 kg), SpO2 100.00%.Body mass index  is 26.24 kg/(m^2).  General Appearance: Disheveled  Eye Sport and exercise psychologist::  Fair  Speech:  Normal Rate  Volume:  Normal  Mood:  Depressed  Affect:  Congruent  Thought Process:  Coherent  Orientation:  Full (Time, Place, and Person)  Thought Content:  WDL  Suicidal Thoughts:  No  Homicidal Thoughts:  No  Memory:  Immediate;   Fair Recent;   Fair Remote;   Fair  Judgement:  Poor  Insight:  Fair  Psychomotor Activity:  Decreased  Concentration:  Fair  Recall:  [employer] of Knowledge:Fair  Language: Fair  Akathisia:  No  Handed:  Right  AIMS (if indicated):     Assets:  Communication Skills Desire for Improvement Financial Resources/Insurance Housing Intimacy Leisure Time [place] Talents/Skills Transportation  Sleep:      Musculoskeletal: Strength & Muscle Tone: within normal limits Gait & Station: normal Patient leans: N/A  Treatment Plan Summary: Daily contact with patient to assess and evaluate symptoms and progress in treatment Medication management; ativan protocol in place; admit to inpatient at [facility] for stability.  Waylan Boga, [place] 03/15/2014 9:04 AM  I have personally seen the patient and agreed with the findings and involved in the treatment plan. Berniece Andreas, MD
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
## 2014-03-15 NOTE — BH Assessment (Signed)
Assessment Note  Jeremy Pratt is an 43 y.o. male presenting to [facility] ED requesting alcohol detox. Pt reported that he has been struggling with his alcohol use for the past 6 months. Pt denies SI, HI and AVH at this time. PT did not report any previous suicide attempts or share a psychiatric history. Pt is currently endorsing some depressive symptoms and reported that he gets 6-7 hours of sleep at night but it is usually interrupted every 2-3 hours. Pt did not report any issues with his appetite but shared that he has lost 220lbs since having gastric bypass surgery. Pt did not report any upcoming court dates or pending criminal charges. PT reported that he owns several guns. Pt did not report any physical, sexual or emotional abuse at this time.  Pt is alert and oriented x3. PT is calm and cooperative throughout this assessment. PT maintained good eye contact and his motor behavior appear within normal limits. PT speech is normal and his thought process is coherent and relevant. PT mood is pleasant and his affect is congruent to his mood.   Axis I: Alcohol intoxication, with moderate or severe use disorder  Axis II: No diagnosis Axis III:  Past Medical History  Diagnosis Date  . Hypertension   . Gout   . History of concussion 01/05/2014   Axis IV: other psychosocial or environmental problems Axis V: 41-50 serious symptoms  Past Medical History:  Past Medical History  Diagnosis Date  . Hypertension   . Gout   . History of concussion 01/05/2014    Past Surgical History  Procedure Laterality Date  . Gastric bypass    . Back surgery    . Carpal tunnel release    . Knee surgery      Family History:  Family History  Problem Relation Age of Onset  . Emphysema Father     Social History:  reports that he has never smoked. He has never used smokeless tobacco. He reports that he drinks alcohol. He reports that he does not use illicit drugs.  Additional Social History:  Alcohol / Drug  Use History of alcohol / drug use?: Yes Substance #1 Name of Substance 1: Alcohol 1 - Age of First Use: 18 1 - Amount (size/oz): 1/5 of liquor 1 - Frequency: daily 1 - Duration: 4 mos 1 - Last Use / Amount: 03-14-14 unknown  CIWA: CIWA-Ar BP: 147/97 mmHg Pulse Rate: 85 COWS:    Allergies: No Known Allergies  Home Medications:  (Not in a hospital admission)  OB/GYN Status:  No LMP for male patient.  General Assessment Data Location of Assessment: [facility] ED Is this a Tele or Face-to-Face Assessment?: Face-to-Face Is this an Initial Assessment or a Re-assessment for this encounter?: Initial Assessment Living Arrangements: Spouse/significant other;Children Can pt return to current living arrangement?: Yes Admission Status: Voluntary Is patient capable of signing voluntary admission?: Yes Transfer from: Home Referral Source: Self/Family/Friend     [facility] Crisis Care Plan Living Arrangements: Spouse/significant other;Children Name of Psychiatrist: None reported Name of Therapist: None reported   Education Status Is patient currently in school?: No Current Grade: NA Highest grade of school patient has completed: 42 Name of school: NA Contact person: NA  Risk to self with the past 6 months Suicidal Ideation: No Suicidal Intent: No Is patient at risk for suicide?: No Suicidal Plan?: No Access to Means: No What has been your use of drugs/alcohol within the last 12 months?: Alcohol use daily Previous Attempts/Gestures: No How many  times?: 0 Other Self Harm Risks: No other self harm risk identified at this time Triggers for Past Attempts: None known Intentional Self Injurious Behavior: None Family Suicide History: No Recent stressful life event(s):  (Work ) Persecutory voices/beliefs?: No Depression: Yes Depression Symptoms: Tearfulness;Guilt;Loss of interest in usual pleasures;Feeling angry/irritable Substance abuse history and/or treatment for substance abuse?:  Yes Suicide prevention information given to non-admitted patients: Not applicable  Risk to Others within the past 6 months Homicidal Ideation: No Thoughts of Harm to Others: No Current Homicidal Intent: No Current Homicidal Plan: No Access to Homicidal Means: No Identified Victim: NA History of harm to others?: No Assessment of Violence: None Noted Violent Behavior Description: No violent behaviors observed. Pt is calm and cooperative at this time. Does patient have access to weapons?: Yes (Comment) (Guns) Criminal Charges Pending?: No Does patient have a court date: No  Psychosis Hallucinations: None noted Delusions: None noted  Mental Status Report Appear/Hygiene: In scrubs Eye Contact: Good Motor Activity: Freedom of movement Speech: Logical/coherent Level of Consciousness: Alert Mood: Pleasant Affect: Appropriate to circumstance Anxiety Level: Minimal Thought Processes: Coherent;Relevant Judgement: Unimpaired Orientation: Appropriate for developmental age Obsessive Compulsive Thoughts/Behaviors: None  Cognitive Functioning Concentration: Normal Memory: Recent Intact IQ: Average Insight: Good Impulse Control: Fair Appetite: Good Weight Loss: 220 (Gastric bypass surgery ) Weight Gain: 0 Sleep: No Change Total Hours of Sleep: 7 (Awakening every 2-3 hours) Vegetative Symptoms: None  ADLScreening [facility] Assessment Services) Patient's cognitive ability adequate to safely complete daily activities?: Yes Patient able to express need for assistance with ADLs?: Yes Independently performs ADLs?: Yes (appropriate for developmental age)  Prior Inpatient Therapy Prior Inpatient Therapy: No  Prior Outpatient Therapy Prior Outpatient Therapy: No  ADL Screening (condition at time of admission) Patient's cognitive ability adequate to safely complete daily activities?: Yes Is the patient deaf or have difficulty hearing?: No Does the patient have difficulty seeing, even  when wearing glasses/contacts?: No Does the patient have difficulty concentrating, remembering, or making decisions?: Yes Patient able to express need for assistance with ADLs?: Yes Does the patient have difficulty dressing or bathing?: No Independently performs ADLs?: Yes (appropriate for developmental age)       Abuse/Neglect Assessment (Assessment to be complete while patient is alone) Physical Abuse: Denies Verbal Abuse: Denies Sexual Abuse: Denies Exploitation of patient/patient's resources: Denies Self-Neglect: Denies Values / Beliefs Cultural Requests During Hospitalization: None Spiritual Requests During Hospitalization: None        Additional Information 1:1 In Past 12 Months?: No CIRT Risk: No Elopement Risk: No Does patient have medical clearance?: Yes     Disposition:  Disposition Initial Assessment Completed for this Encounter: Yes Disposition of Patient: Inpatient treatment program Type of inpatient treatment program: Adult  On Site Evaluation by:   Reviewed with Physician:    Lahoma Rocker 03/15/2014 12:42 AM

## 2014-03-15 NOTE — ED Notes (Signed)
Family has belonging.

## 2014-03-15 NOTE — BHH Group Notes (Signed)
Adult Psychoeducational Group Note  Date:  03/15/2014 Time:  9:47 PM  Group Topic/Focus:  Wrap-Up Group:   The focus of this group is to help patients review their daily goal of treatment and discuss progress on daily workbooks.  Participation Level:  Active  Participation Quality:  Appropriate and Attentive  Affect:  Appropriate  Cognitive:  Alert and Appropriate  Insight: Appropriate  Engagement in Group:  Engaged  Modes of Intervention:  Discussion  Additional Comments:  He just introduced himself to the group, since this is his first day at the facility.   Ronelle Nigh D 03/15/2014, 9:47 PM
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
## 2014-03-15 NOTE — BH Assessment (Signed)
Jeremy Pratt, AC at [facility], confirms adult unit is currently at capacity. Contacted the following facilities for placement:  BED AVAILABLE, FAXED CLINICAL INFORMATION: [facility], per Tomika [facility], per Peggy  AT CAPACITY: [facility], per Michael [facility], per Jonathan [facility], per Elva [facility], per Lynn [facility], per Pat [facility], per Rhonda [facility], per Candace [facility], per Pamela [facility], per Donna [facility], per Julie [facility], per Dixie [facility], per Katelynn [facility], per Sharita  NO RESPONSE: [facility]   Kipling Graser Ellis Derral Colucci Jr, LPC, NCC Triage Specialist [phone] 

## 2014-03-15 NOTE — ED Notes (Signed)
Patient transferred to [facility] with [employer].

## 2014-03-16 DIAGNOSIS — F10939 Alcohol use, unspecified with withdrawal, unspecified: Secondary | ICD-10-CM

## 2014-03-16 DIAGNOSIS — F1020 Alcohol dependence, uncomplicated: Principal | ICD-10-CM

## 2014-03-16 DIAGNOSIS — F10239 Alcohol dependence with withdrawal, unspecified: Secondary | ICD-10-CM

## 2014-03-16 MED ORDER — HYDROXYZINE HCL 25 MG PO TABS
25.0000 mg | ORAL_TABLET | Freq: Four times a day (QID) | ORAL | Status: DC | PRN
  Filled 2014-03-16: qty 1

## 2014-03-16 MED ORDER — LORAZEPAM 1 MG PO TABS
1.0000 mg | ORAL_TABLET | Freq: Three times a day (TID) | ORAL | Status: AC
  Administered 2014-03-17 (×3): 1 mg via ORAL
  Filled 2014-03-16 (×4): qty 1

## 2014-03-16 MED ORDER — LOPERAMIDE HCL 2 MG PO CAPS: 2.0000 mg | ORAL_CAPSULE | ORAL | Status: DC | PRN

## 2014-03-16 MED ORDER — VITAMIN B-1 100 MG PO TABS
100.0000 mg | ORAL_TABLET | Freq: Every day | ORAL | Status: DC
  Filled 2014-03-16 (×2): qty 1

## 2014-03-16 MED ORDER — LORAZEPAM 1 MG PO TABS
1.0000 mg | ORAL_TABLET | Freq: Four times a day (QID) | ORAL | Status: DC | PRN
  Administered 2014-03-18: 1 mg via ORAL
  Filled 2014-03-16: qty 1

## 2014-03-16 MED ORDER — THIAMINE HCL 100 MG/ML IJ SOLN
100.0000 mg | Freq: Once | INTRAMUSCULAR | Status: AC
  Administered 2014-03-16: 100 mg via INTRAMUSCULAR
  Filled 2014-03-16: qty 2

## 2014-03-16 MED ORDER — LORAZEPAM 1 MG PO TABS
1.0000 mg | ORAL_TABLET | Freq: Two times a day (BID) | ORAL | Status: DC
  Administered 2014-03-18: 1 mg via ORAL
  Filled 2014-03-16: qty 1

## 2014-03-16 MED ORDER — LORAZEPAM 1 MG PO TABS
1.0000 mg | ORAL_TABLET | Freq: Four times a day (QID) | ORAL | Status: AC
  Administered 2014-03-16 (×3): 1 mg via ORAL
  Filled 2014-03-16 (×2): qty 1

## 2014-03-16 MED ORDER — ADULT MULTIVITAMIN W/MINERALS CH
1.0000 | ORAL_TABLET | Freq: Every day | ORAL | Status: DC
  Filled 2014-03-16 (×3): qty 1

## 2014-03-16 MED ORDER — LORAZEPAM 1 MG PO TABS: 1.0000 mg | ORAL_TABLET | Freq: Every day | ORAL | Status: DC

## 2014-03-16 MED ORDER — ONDANSETRON 4 MG PO TBDP: 4.0000 mg | ORAL_TABLET | Freq: Four times a day (QID) | ORAL | Status: DC | PRN

## 2014-03-16 NOTE — Tx Team (Signed)
Initial Interdisciplinary Treatment Plan   PATIENT STRESSORS: Health problems Substance abuse   PROBLEM LIST: Problem List/Patient Goals Date to be addressed Date deferred Reason deferred Estimated date of resolution  Substance abuse/alcohol 03/16/2014   D/c        anxiety 03/16/2014   D/c                                       DISCHARGE CRITERIA:  Ability to meet basic life and health needs Adequate post-discharge living arrangements Improved stabilization in mood, thinking, and/or behavior Motivation to continue treatment in a less acute level of care Need for constant or close observation no longer present Reduction of life-threatening or endangering symptoms to within safe limits Safe-care adequate arrangements made Verbal commitment to aftercare and medication compliance Withdrawal symptoms are absent or subacute and managed without 24-hour nursing intervention  PRELIMINARY DISCHARGE PLAN: Attend aftercare/continuing care group Outpatient therapy  PATIENT/FAMIILY INVOLVEMENT: This treatment plan has been presented to and reviewed with the patient, Jeremy Pratt.  The patient and family have been given the opportunity to ask questions and make suggestions.  Ralph Leyden 03/16/2014, 1:53 PM

## 2014-03-16 NOTE — BHH Suicide Risk Assessment (Signed)
   Nursing information obtained from:    Demographic factors:   43 year old married man, tow children, self employed Current Mental Status:   See below Loss Factors:   None acute- stressful work Historical Factors:   Alcohol Dependence Risk Reduction Factors:   No psychiatric history, no depression, sense of responsibility to family, good support system Total Time spent with patient: 45 minutes  CLINICAL FACTORS:   Alcohol/Substance Abuse/Dependencies  Psychiatric Specialty Exam: Physical Exam  ROS  Blood pressure 131/94, pulse 77, temperature 98.3 F (36.8 C), temperature source Oral, resp. rate 20, height  (1.981 m), weight 109.317 kg (241 lb), SpO2 98.00%.Body mass index is 27.86 kg/(m^2).  See Admit Note MSE  COGNITIVE FEATURES THAT CONTRIBUTE TO RISK:  Closed-mindedness    SUICIDE RISK:   Mild:  Suicidal ideation of limited frequency, intensity, duration, and specificity.  There are no identifiable plans, no associated intent, mild dysphoria and related symptoms, good self-control (both objective and subjective assessment), few other risk factors, and identifiable protective factors, including available and accessible social support.  PLAN OF CARE: Admit patient to inpatient unit for the purpose of detoxification/ management of withdrawal. Provide support and encouragement regarding sobriety/abstinence efforts. Will follow daily.   I certify that inpatient services furnished can reasonably be expected to improve the patient's condition.  COBOS, FERNANDO 03/16/2014, 11:54 AM

## 2014-03-16 NOTE — Progress Notes (Signed)
Patient ID: Jeremy Pratt, male   DOB: 13-May-1971, 43 y.o.   MRN: [id number] D-Patient reports he slept well and his appetite is good.  His energy level is normal and his concentration is good.  He is rating depression, anxiety and hopelessness at 0/10.A- Supported patient.  R- patient is only complaining of anxiety , no other detox symptoms.  He has been participating in all activities.  His written goal for today is to follow the program.

## 2014-03-16 NOTE — Progress Notes (Signed)
D: Patient in the hallway on approach.  Patient states he had a much better day.  Patient states the withdrawal symptoms have gotten better and are minimal now.  Patient states when discharged he will follow up with a therapist.  Patient states he wants to get back to work and his life.  Patient denies SI/HI and denies AVH. A: Staff to monitor Q 15 mins for safety.  Encouragement and support offered.  Scheduled medications administered per orders.  Vistaril administered prn for anxiety. R: Patient remains safe on the unit.  Patient attended AA group tonight.  Patient visible on the unit.  Patient taking administered medications.

## 2014-03-16 NOTE — Progress Notes (Signed)
Recreation Therapy Notes  Animal-Assisted Activity/Therapy (AAA/T) Program Checklist/Progress Notes Patient Eligibility Criteria Checklist & Daily Group note for Rec Tx Intervention  Date: 08.25.2015 Time: 2:45pm Location: [address] Dayroom   AAA/T Program Assumption of Risk Form signed by Patient/ or Parent Legal Guardian yes  Patient is free of allergies or sever asthma yes  Patient reports no fear of animals yes  Patient reports no history of cruelty to animals yes   Patient understands his/her participation is voluntary yes  Patient washes hands before animal contact yes  Patient washes hands after animal contact yes  Behavioral Response: Appropriate   Education: Hand Washing, Appropriate Animal Interaction   Education Outcome: Acknowledges understanding   Clinical Observations/Feedback: Patient attended and participated appropriately with therapeutic dog team.   Jeremy Pratt L Aquita Simmering, LRT/CTRS  Martavis Gurney L 03/16/2014 4:41 PM 

## 2014-03-16 NOTE — H&P (Signed)
Psychiatric Admission Assessment Adult  Patient Identification:  Jeremy Pratt Date of Evaluation:  03/16/2014 Chief Complaint:  ETOH DEPENDENCE History of Present Illness:This is a 43 year old man with a history of alcohol dependence. He states he was a social drinker for years, but over the last 6 months has progressed to daily consumption of up to one fifth of liquor. He endorses morning drinking, hiding his drinking from wife and associates. He last drank two days ago.  BAL was 362 upon admission. At this time he is experiencing slight WDL symptoms, such as slight tremulousness, but his vitals are stable. He does have a history of some significant withdrawal in the past, to include one withdrawal related seizure several months ago. He denies any significant psychiatric symptoms or history, except for some anxiety related to his work.  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  Elements: Chronic Alcohol Dependence, has been progressing in severity Associated Signs/Synptoms: Depression Symptoms:  Denies depressive symptoms or significant neuro-vegetative symptoms (Hypo) Manic Symptoms:  Denies  Anxiety Symptoms:  Endorses worrying about business/finances , but denies panic or agoraphobia Psychotic Symptoms:  denies PTSD Symptoms:does not endorse - as noted, he had an accident several months ago, but is denying nightmares, avoidance, intrusive memories  Total Time spent with patient: 45 minutes  Psychiatric Specialty Exam: Physical Exam  Review of Systems  Constitutional: Negative for fever and chills.  HENT: Negative for hearing loss and tinnitus.   Eyes: Negative.  Negative for double vision.       Had diplopia for several days following head trauma, but this  has resolved   Respiratory: Negative for cough and shortness of breath.   Cardiovascular: Negative for chest pain.  Gastrointestinal: Positive for diarrhea. Negative for nausea, vomiting and abdominal pain.       Describes chronic loose stools since bariatric surgery  Neurological: Negative for dizziness and headaches.  Psychiatric/Behavioral: Positive for substance abuse.       Alcohol    Blood pressure 131/94, pulse 77, temperature 98.3 F (36.8 C), temperature source Oral, resp. rate 20, height 6' 6"  (1.981 m), weight 109.317 kg (241 lb), SpO2 98.00%.Body mass index is 27.86 kg/(m^2).  General Appearance: Fairly Groomed  Engineer, water::  Good  Speech:  Normal Rate  Volume:  Normal  Mood:  Euthymic  Affect:  Appropriate and Congruent  Thought Process:  Goal Directed and Linear  Orientation:  Other:  fully alert and attentive  Thought Content:  no hallucinations, no delusions   Suicidal Thoughts:  No- denies any suicidal or homicidal ideations  Homicidal Thoughts:  No  Memory:  NA  Judgement:  Fair  Insight:  Fair  Psychomotor Activity:  Normal  Concentration:  Good  Recall:  Good  Fund of Knowledge:Good  Language: Good  Akathisia:  Negative  Handed:  Right  AIMS (if indicated):     Assets:  Communication Skills Desire for Improvement Financial Resources/Insurance Resilience Social Support  Sleep:  Number of Hours: 6    Musculoskeletal: Strength & Muscle Tone: within normal limits- gait is slowed but steady- mild distal tremors, no diaphoresis Gait & Station: normal Patient leans: N/A  Past Psychiatric History: Diagnosis: Denies psychiatric history other than alcohol dependence. Denies history of depression, mania, psychosis, panic, agoraphobia, PTSD, OCD  Hospitalizations:  None prior  Outpatient Care: None currently  Substance Abuse Care:None prior  Self-Mutilation: Denies   Suicidal Attempts:Denies   Violent Behaviors: Denies    Past Medical History:  History of Obesity- had  developed HTN and Glucose Intolerance- had  Bariatric Surgery 2 years ago, after which he lost 150 + pounds, with improvement of glycemic control and BP. Does not smoke. History of knee surgery/carpal tunnel. History of closed head injury  Several months ago- work up ( states he had several CT scans/MRIs ) was negative. Past Medical History  Diagnosis Date  . Hypertension   . Gout   . History of concussion 01/05/2014   Loss of Consciousness:  denies, no LOC after head trauma Seizure History:  (+) ETOH WDL seizure several months ago Allergies:  No Known Allergies NKDA PTA Medications: Prescriptions prior to admission  Medication Sig Dispense Refill  . bismuth subsalicylate (PEPTO BISMOL) 262 MG/15ML suspension Take 30 mLs by mouth every 6 (six) hours as needed for indigestion or diarrhea or loose stools.      . calcium carbonate (TUMS - DOSED IN MG ELEMENTAL CALCIUM) 500 MG chewable tablet Chew 2 tablets by mouth 2 (two) times daily.      . Cholecalciferol (VITAMIN D3) 2000 UNITS TABS Take 1 tablet by mouth daily.      . colchicine 0.6 MG tablet Take 0.6 mg by mouth daily.      . ferrous sulfate 325 (65 FE) MG tablet Take 325 mg by mouth daily with breakfast.      . hydrOXYzine (VISTARIL) 50 MG capsule Take 1 capsule by mouth 2 (two) times daily as needed. anxiety      . Multiple Vitamins-Minerals (MULTIVITAMIN WITH MINERALS) tablet Take 1 tablet by mouth daily.      . Olmesartan-Amlodipine-HCTZ (TRIBENZOR) 40-5-12.5 MG TABS Take 1 tablet by mouth every morning.      Marland Kitchen omeprazole (PRILOSEC) 20 MG capsule Take 20 mg by mouth 2 (two) times daily before a meal.      . PARoxetine (PAXIL) 20 MG tablet Take 1 tablet by mouth 2 (two) times daily.      . psyllium (HYDROCIL/METAMUCIL) 95 % PACK Take 1 packet by mouth daily.      . ursodiol (ACTIGALL) 300 MG capsule Take 300 mg by mouth 2 (two) times daily.      . vitamin B-12 (CYANOCOBALAMIN) 500 MCG tablet Take 500 mcg by mouth daily.         Previous Psychotropic Medications:  Medication/Dose  States he has never been on any psychiatric medications               Substance Abuse History in the last 12 months:  Yes.   Alcohol dependence, drinking one fifth of liquor per day. Last drank 2 days ago. Denies any history of drug abuse. Consequences of Substance Abuse: No DUIs, (+) falls, (+) withdrawal seizure , no DTs  Social History:  reports that he has never smoked. He has never used smokeless tobacco. He reports that he drinks alcohol. He reports that he does not use illicit drugs. 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 Legal History:  Denies  Hobbies/Interests:  Family History:  Father was alcoholic. Now deceased. Mother alive, lives with patient. No mental illness in family, no suicides in family  Family History  Problem Relation Age of Onset  . Emphysema Father     Results for orders placed during the hospital encounter of 03/14/14 (from the past 72 hour(s))  CBC WITH DIFFERENTIAL     Status: Abnormal   Collection Time    03/14/14 10:25 PM      Result Value Ref Range   WBC 7.3  4.0 - 10.5 K/uL   RBC 4.74  4.22 - 5.81 MIL/uL   Hemoglobin 15.3  13.0 - 17.0 g/dL   HCT 43.9  39.0 - 52.0 %   MCV 92.6  78.0 - 100.0 fL   MCH 32.3  26.0 - 34.0 pg   MCHC 34.9  30.0 - 36.0 g/dL   RDW 12.9  11.5 - 15.5 %   Platelets 330  150 - 400 K/uL   Neutrophils Relative % 69  43 - 77 %   Neutro Abs 5.0  1.7 - 7.7 K/uL   Lymphocytes Relative 29  12 - 46 %   Lymphs Abs 2.1  0.7  - 4.0 K/uL   Monocytes Relative 2 (*) 3 - 12 %   Monocytes Absolute 0.2  0.1 - 1.0 K/uL   Eosinophils Relative 0  0 - 5 %   Eosinophils Absolute 0.0  0.0 - 0.7 K/uL   Basophils Relative 0  0 - 1 %   Basophils Absolute 0.0  0.0 - 0.1 K/uL  COMPREHENSIVE METABOLIC PANEL     Status: Abnormal   Collection Time    03/14/14 10:25 PM      Result Value Ref Range   Sodium 145  137 - 147 mEq/L   Potassium 3.7  3.7 - 5.3 mEq/L   Chloride 106  96 - 112 mEq/L   CO2 23  19 - 32 mEq/L   Glucose, Bld 162 (*) 70 - 99 mg/dL   BUN 7  6 - 23 mg/dL   Creatinine, Ser 0.75  0.50 - 1.35 mg/dL   Calcium 7.9 (*) 8.4 - 10.5 mg/dL   Total Protein 6.5  6.0 - 8.3 g/dL   Albumin 3.1 (*) 3.5 - 5.2 g/dL   AST 72 (*) 0 - 37 U/L   ALT 84 (*) 0 - 53 U/L   Alkaline Phosphatase 145 (*) 39 - 117 U/L   Total Bilirubin 0.4  0.3 - 1.2 mg/dL   GFR calc non Af Amer >90  >90 mL/min   GFR calc Af Amer >90  >90 mL/min   Comment: (NOTE)     The eGFR has been calculated using the CKD EPI equation.     This calculation has not been validated in all clinical situations.     eGFR's persistently <90 mL/min signify possible Chronic Kidney     Disease.   Anion gap 16 (*) 5 - 15  ETHANOL     Status: Abnormal   Collection Time    03/14/14 10:25 PM      Result Value Ref Range   Alcohol, Ethyl (B) 362 (*) 0 - 11 mg/dL   Comment:            LOWEST DETECTABLE LIMIT FOR     SERUM ALCOHOL IS 11 mg/dL     FOR MEDICAL PURPOSES ONLY  URINE RAPID DRUG SCREEN (HOSP PERFORMED)     Status: Abnormal   Collection Time  03/15/14  1:03 AM      Result Value Ref Range   Opiates NONE DETECTED  NONE DETECTED   Cocaine NONE DETECTED  NONE DETECTED   Benzodiazepines POSITIVE (*) NONE DETECTED   Amphetamines NONE DETECTED  NONE DETECTED   Tetrahydrocannabinol NONE DETECTED  NONE DETECTED   Barbiturates NONE DETECTED  NONE DETECTED   Comment:            DRUG SCREEN FOR MEDICAL PURPOSES     ONLY.  IF CONFIRMATION IS NEEDED     FOR ANY  PURPOSE, NOTIFY LAB     WITHIN 5 DAYS.                LOWEST DETECTABLE LIMITS     FOR URINE DRUG SCREEN     Drug Class       Cutoff (ng/mL)     Amphetamine      1000     Barbiturate      200     Benzodiazepine   478     Tricyclics       295     Opiates          300     Cocaine          300     THC              50   Psychological Evaluations:  Assessment:   This is a 43 year old man, who reports he has been consuming alcohol daily and heavily over the last 6 months. He states prior to that he had been a social drinker. He states 6 months ago he had a head trauma , and although there was no LOC or severe neurologic compromise, he feels it triggered his heavy drinking. He has been progressively more concerned about his alcohol dependence, and requested admission for detoxification. He does have a history of a withdrawal seizure, which occurred a few months ago 3 days after last drink.  Currently vitals stable, slight tremors.  He denies psychiatric history and does not endorse depression/mood disorder/psychotic symptoms. He does describe some anxiety, related to stress of owning a business.   AXIS I:  Alcohol Dependence, Alcohol WDL AXIS II:  Deferred AXIS III:   Past Medical History  Diagnosis Date  . Hypertension   . Gout   . History of concussion 01/05/2014   AXIS IV:  stressful work AXIS V:  41-50 serious symptoms  Treatment Plan/Recommendations: Admit patient to inpatient unit for the purpose of detoxification/ management of withdrawal. Provide support and encouragement regarding sobriety/abstinence efforts. Will follow daily.   Treatment Plan Summary: Daily contact with patient to assess and evaluate symptoms and progress in treatment Medication management See below Current Medications:  Current Facility-Administered Medications  Medication Dose Route Frequency Provider Last Rate Last Dose  . acetaminophen (TYLENOL) tablet 650 mg  650 mg Oral Q6H PRN Waylan Boga, NP       . alum & mag hydroxide-simeth (MAALOX/MYLANTA) 200-200-20 MG/5ML suspension 30 mL  30 mL Oral Q4H PRN Waylan Boga, NP      . amLODipine (NORVASC) tablet 5 mg  5 mg Oral Daily Waylan Boga, NP   5 mg at 03/16/14 0815  . cholecalciferol (VITAMIN D) tablet 2,000 Units  2,000 Units Oral Daily Waylan Boga, NP   2,000 Units at 03/16/14 0800  . cloNIDine (CATAPRES) tablet 0.1 mg  0.1 mg Oral TID PRN Waylan Boga, NP   0.1 mg at 03/15/14 2133  .  cyanocobalamin tablet 500 mcg  500 mcg Oral Daily Waylan Boga, NP   500 mcg at 03/16/14 0815  . folic acid (FOLVITE) tablet 1 mg  1 mg Oral Daily Waylan Boga, NP   1 mg at 03/16/14 0815  . hydrochlorothiazide (MICROZIDE) capsule 12.5 mg  12.5 mg Oral Daily Waylan Boga, NP   12.5 mg at 03/16/14 0815  . hydrOXYzine (ATARAX/VISTARIL) tablet 25 mg  25 mg Oral Q6H PRN Waylan Boga, NP   25 mg at 03/16/14 0640  . irbesartan (AVAPRO) tablet 300 mg  300 mg Oral Daily Waylan Boga, NP   300 mg at 03/16/14 5409  . LORazepam (ATIVAN) tablet 1 mg  1 mg Oral Q6H PRN Nicholaus Bloom, MD       Or  . LORazepam (ATIVAN) injection 1 mg  1 mg Intramuscular Q6H PRN Nicholaus Bloom, MD      . LORazepam (ATIVAN) tablet 0-4 mg  0-4 mg Oral Q6H Waylan Boga, NP   1 mg at 03/16/14 0743   Followed by  . [START ON 03/17/2014] LORazepam (ATIVAN) tablet 0-4 mg  0-4 mg Oral Q12H Waylan Boga, NP      . magnesium hydroxide (MILK OF MAGNESIA) suspension 30 mL  30 mL Oral Daily PRN Waylan Boga, NP      . multivitamin with minerals tablet 1 tablet  1 tablet Oral Daily Waylan Boga, NP   1 tablet at 03/16/14 0815  . pantoprazole (PROTONIX) EC tablet 40 mg  40 mg Oral BID AC Waylan Boga, NP   40 mg at 03/16/14 0640  . potassium chloride (K-DUR,KLOR-CON) CR tablet 10 mEq  10 mEq Oral Daily Waylan Boga, NP   10 mEq at 03/16/14 0815  . thiamine (VITAMIN B-1) tablet 100 mg  100 mg Oral Daily Waylan Boga, NP   100 mg at 03/16/14 8119   Or  . thiamine (B-1) injection 100 mg  100 mg Intravenous  Daily Waylan Boga, NP      . ursodiol (ACTIGALL) capsule 300 mg  300 mg Oral BID Waylan Boga, NP        Observation Level/Precautions:  15 minute checks  Laboratory:  HbAIC to follow up on elevated glucose level  Psychotherapy:  Support, milieu, group therapy  Medications:  ATIVAN as per CIWA protocol  Consultations:  As needed  Discharge Concerns:  Stressful work.   Estimated LOS: 4 days  Other:     I certify that inpatient services furnished can reasonably be expected to improve the patient's condition.   Kees Idrovo 8/25/20158:55 AM

## 2014-03-16 NOTE — Progress Notes (Signed)
The focus of this group is to educate the patient on the purpose and policies of crisis stabilization and provide a format to answer questions about their admission.  The group details unit policies and expectations of patients while admitted. Patient came to group but was called out by MD.

## 2014-03-16 NOTE — [facility] Group Notes (Signed)
[facility] LCSW Group Therapy  03/16/2014   1:15 PM   Type of Therapy:  Group Therapy  Participation Level:  Active  Participation Quality:  Attentive, Sharing and Supportive  Affect:  Appropriate  Cognitive:  Alert and Oriented  Insight:  Developing/Improving and Engaged  Engagement in Therapy:  Developing/Improving and Engaged  Modes of Intervention:  Clarification, Confrontation, Discussion, Education, Exploration, Limit-setting, Orientation, Problem-solving, Rapport Building, Dance movement psychotherapist, Socialization and Support  Summary of Progress/Problems: The topic for group therapy was feelings about diagnosis.  Pt actively participated in group discussion on their past and current diagnosis and how they feel towards this.  Pt also identified how society and family members judge them, based on their diagnosis as well as stereotypes and stigmas. Patient discussed with group that he experiences fear relating to not being able to move forward with his life and provide for his family. 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 He identified stress as a trigger for his drinking. He was able to identify alternative healthy coping strategies to deal with his stress such as building things and spending time outdoors. CSW and other group members provided patient with emotional support and encouragement.   Samuella Bruin, MSW, [employer] Clinical Social Worker [facility] [phone]

## 2014-03-16 NOTE — Progress Notes (Signed)
Adult Psychoeducational Group Note  Date:  03/16/2014 Time:  9:57 PM  Group Topic/Focus:  Wrap-Up Group:   The focus of this group is to help patients review their daily goal of treatment and discuss progress on daily workbooks.  Participation Level:  Active  Participation Quality:  Appropriate  Affect:  Appropriate  Cognitive:  Appropriate  Insight: Appropriate  Engagement in Group:  Improving  Modes of Intervention:  Problem-solving  Additional Comments:  PT was in the AA group engaged with the people from AA   Xochitl Egle R 03/16/2014, 9:57 PM

## 2014-03-17 MED ORDER — TRAZODONE HCL 50 MG PO TABS
50.0000 mg | ORAL_TABLET | Freq: Every evening | ORAL | Status: DC | PRN
  Administered 2014-03-17 (×2): 50 mg via ORAL
  Filled 2014-03-17 (×6): qty 1
  Filled 2014-03-17 (×2): qty 8

## 2014-03-17 MED ORDER — ACAMPROSATE CALCIUM 333 MG PO TBEC
666.0000 mg | DELAYED_RELEASE_TABLET | Freq: Three times a day (TID) | ORAL | Status: DC
Start: 2014-03-17 — End: 2014-03-18
  Administered 2014-03-17 – 2014-03-18 (×4): 666 mg via ORAL
  Filled 2014-03-17: qty 24
  Filled 2014-03-17 (×4): qty 2
  Filled 2014-03-17: qty 24
  Filled 2014-03-17 (×2): qty 2
  Filled 2014-03-17: qty 24

## 2014-03-17 NOTE — [facility] Counselor (Signed)
Adult Comprehensive Assessment  Patient ID: Jeremy Pratt, male   DOB: Dec 16, 1970, 43 y.o.   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 [place] Family Relationships: None Surveyor, quantity / Lack of resources (include bankruptcy): None Housing / Lack of housing: None Physical health (include injuries & life threatening diseases): Issues from having a concussion in April Social relationships: None Substance abuse: Patient reports drinking a fifth of liquor daily Bereavement / Loss: None  Living/Environment/Situation:  Living Arrangements: Spouse/significant other;Children Living conditions (as described by patient or guardian): Good How long has patient lived in current situation?: 11years What is atmosphere in current home: Comfortable;Loving;Supportive  Family History:  Marital status: Married Number of Years Married: 20 What types of issues is patient dealing with in the relationship?: None Additional relationship information: None Does patient have children?: Yes How many children?: 2 How is patient's relationship with their children?: Extemely good relationship with sons  Childhood History:  By whom was/is the patient raised?: Both parents Additional childhood history information: Very pleasant Description of patient's relationship with caregiver when they were a child: Good relationship Patient's description of current relationship with people who raised him/her: Very close - father is deceased Does patient have siblings?: Yes Number of Siblings: 1 Description of patient's current relationship with siblings: Good relationship but brother lives in another state Did patient suffer any verbal/emotional/physical/sexual abuse as a child?: No Did patient suffer from severe childhood neglect?: No Has  patient ever been sexually abused/assaulted/raped as an adolescent or adult?: No Was the patient ever a victim of a crime or a disaster?: No Witnessed domestic violence?: No Has patient been effected by domestic violence as an adult?: No  Education:  Highest grade of school patient has completed: Three years of c ollege Currently a student?: No Learning disability?: No  Employment/Work Situation:   Employment situation: Employed 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 How long has patient been employed?: 22 years Patient's job has been impacted by current illness: No What is the longest time patient has a held a job?: 22 years Where was the patient employed at that time?: Self employed Has patient ever been in the [employer]?: No Has patient ever served in Buyer, retail?: No  Financial Resources:   Financial resources: Income from [employer] Insurance insurance Does patient have a representative payee or guardian?: No  Alcohol/Substance Abuse:   What has been your use of drugs/alcohol within the last 12 months?: Drinks a fifth of liquor daily If attempted suicide, did drugs/alcohol play a role in this?: No Alcohol/Substance Abuse Treatment Hx: Denies past history Has alcohol/substance abuse ever caused legal problems?: No  Social Support System:   Forensic psychologist System: None Describe Community Support System: N/A Type of faith/religion: None How does patient's faith help to cope with current illness?: N/A  Leisure/Recreation:   Leisure and Hobbies: Build and work outside  English as a second language teacher:   What things does the patient do well?: Administrator, sports and business management In what areas does patient struggle / problems for patient: Alcohol   Discharge Plan:   Does patient have access to transportation?: Yes Will patient be returning to same living situation after discharge?: Yes Currently receiving community mental health services:  No If no, would patient like referral for services when discharged?: Yes (What county?) [facility] [place]) Does patient have financial barriers  related to discharge medications?: No  Summary/Recommendations:  Jauan Wohl is a 43 years old Caucasian male admitted with Alcohol Dependence.  He will benefit from crisis stabilization, evaluation for medication, psycho-education groups for coping skills development, group therapy and case management for discharge planning.     Savannha Welle, Joesph July. 03/17/2014

## 2014-03-17 NOTE — Progress Notes (Signed)
[facility] MD Progress Note  03/17/2014 10:52 AM Jeremy Pratt  MRN:  [id number] Subjective:   Patient states he is feeling better Objective:  At this time patient is improved- there are no significant withdrawal symptoms- no tremors, no diaphoresis, no restlessnesshe does have slightly elevated BP ( 135/99). Mood is "OK" and denies depression, sadness or significant anxiety. 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 States his family is very supportive. Behavior on unit in good control- going to groups. Tolerating Ativan Taper well without side effects. We discussed importance of ongoing treatment after discharge and reviewed importance of 12 step ( AA) participation. He states he is motivated in AA participation. We discussed medication options, to include Campral, Naltrexone, and Antabuse. He is interested in Campral, and we reviewed side effect profile. He is hoping  For discharge soon.  Diagnosis: Alcohol Dependence, alcohol withdrawal   Total Time spent with patient: 25 minutes   ADL's:  Improved   Sleep: fair   Appetite:  Good   Suicidal Ideation:  Denies  Homicidal Ideation:  Denies  AEB (as evidenced by):  Psychiatric Specialty Exam: Physical Exam  Review of Systems  Constitutional: Negative for fever and chills.  Respiratory: Negative for cough and shortness of breath.   Cardiovascular: Negative for chest pain.  Psychiatric/Behavioral: Positive for substance abuse. Negative for depression.    Blood pressure 135/99, pulse 96, temperature 98.3 F (36.8 C), temperature source Oral, resp. rate 18, height  (1.981 m), weight 109.317 kg (241 lb), SpO2 98.00%.Body mass index is 27.86 kg/(m^2).  General Appearance: Fairly Groomed  Patent attorney::  Good  Speech:  Normal Rate  Volume:  Normal  Mood:  Euthymic  Affect:  Appropriate  Thought  Process:  Goal Directed and Linear  Orientation:  Full (Time, Place, and Person)  Thought Content:  no hallucinations, no,delusions  Suicidal Thoughts:  No denies any suicidal or homicidal ideations  Homicidal Thoughts:  No  Memory:  NA  Judgement:  Good  Insight:  Fair  Psychomotor Activity:  Normal  Concentration:  Good  Recall:  Good  Fund of Knowledge:Good  Language: Good  Akathisia:  Negative  Handed:  Right  AIMS (if indicated):     Assets:  Communication Skills Desire for Improvement Financial Resources/Insurance Resilience Social Support  Sleep:  Number of Hours: 3.25   Musculoskeletal: Strength & Muscle Tone: within normal limits Gait & Station: normal Patient leans: N/A  Current Medications: Current Facility-Administered Medications  Medication Dose Route Frequency Provider Last Rate Last Dose  . acamprosate (CAMPRAL) tablet 666 mg  666 mg Oral TID WC Nehemiah Massed, MD      . acetaminophen (TYLENOL) tablet 650 mg  650 mg Oral Q6H PRN Nanine Means, NP      . alum & mag hydroxide-simeth (MAALOX/MYLANTA) 200-200-20 MG/5ML suspension 30 mL  30 mL Oral Q4H PRN Nanine Means, NP      . amLODipine (NORVASC) tablet 5 mg  5 mg Oral Daily Nanine Means, NP   5 mg at 03/17/14 0751  . cholecalciferol (VITAMIN D) tablet 2,000 Units  2,000 Units Oral Daily Nanine Means, NP   2,000 Units at 03/17/14 0751  . cloNIDine (CATAPRES) tablet 0.1 mg  0.1 mg Oral TID PRN Nanine Means, NP   0.1 mg at 03/15/14 2133  . cyanocobalamin tablet 500 mcg  500 mcg  Oral Daily Nanine Means, NP   500 mcg at 03/17/14 0750  . folic acid (FOLVITE) tablet 1 mg  1 mg Oral Daily Nanine Means, NP   1 mg at 03/17/14 9811  . hydrochlorothiazide (MICROZIDE) capsule 12.5 mg  12.5 mg Oral Daily Nanine Means, NP   12.5 mg at 03/17/14 9147  . hydrOXYzine (ATARAX/VISTARIL) tablet 25 mg  25 mg Oral Q6H PRN Nanine Means, NP   25 mg at 03/16/14 2125  . hydrOXYzine (ATARAX/VISTARIL) tablet 25 mg  25 mg Oral Q6H PRN Sanjuana Kava, NP      . irbesartan (AVAPRO) tablet 300 mg  300 mg Oral Daily Nanine Means, NP   300 mg at 03/17/14 0752  . loperamide (IMODIUM) capsule 2-4 mg  2-4 mg Oral PRN Sanjuana Kava, NP      . LORazepam (ATIVAN) tablet 1 mg  1 mg Oral Q6H PRN Sanjuana Kava, NP      . LORazepam (ATIVAN) tablet 1 mg  1 mg Oral TID Sanjuana Kava, NP   1 mg at 03/17/14 0751   Followed by  . [START ON 03/18/2014] LORazepam (ATIVAN) tablet 1 mg  1 mg Oral BID Sanjuana Kava, NP       Followed by  . [START ON 03/19/2014] LORazepam (ATIVAN) tablet 1 mg  1 mg Oral Daily Sanjuana Kava, NP      . magnesium hydroxide (MILK OF MAGNESIA) suspension 30 mL  30 mL Oral Daily PRN Nanine Means, NP      . multivitamin with minerals tablet 1 tablet  1 tablet Oral Daily Nanine Means, NP   1 tablet at 03/17/14 0751  . ondansetron (ZOFRAN-ODT) disintegrating tablet 4 mg  4 mg Oral Q6H PRN Sanjuana Kava, NP      . pantoprazole (PROTONIX) EC tablet 40 mg  40 mg Oral BID AC Nanine Means, NP   40 mg at 03/17/14 0630  . potassium chloride (K-DUR,KLOR-CON) CR tablet 10 mEq  10 mEq Oral Daily Nanine Means, NP   10 mEq at 03/17/14 0751  . thiamine (VITAMIN B-1) tablet 100 mg  100 mg Oral Daily Nanine Means, NP   100 mg at 03/17/14 0750   Or  . thiamine (B-1) injection 100 mg  100 mg Intravenous Daily Nanine Means, NP      . ursodiol (ACTIGALL) capsule 300 mg  300 mg Oral BID Nanine Means, NP   300 mg at 03/17/14 0750    Lab Results: No results found for this or any previous visit (from the past 48 hour(s)).  Physical Findings: AIMS: Facial and Oral Movements Muscles of Facial Expression: None, normal Lips and Perioral Area: None, normal Jaw: None, normal Tongue: None, normal,Extremity Movements Upper (arms, wrists, hands, fingers): None, normal Lower (legs, knees, ankles, toes): None, normal, Trunk Movements Neck, shoulders, hips: None, normal, Overall Severity Severity of abnormal movements (highest score from questions above):  None, normal Incapacitation due to abnormal movements: None, normal Patient's awareness of abnormal movements (rate only patient's report): No Awareness, Dental Status Current problems with teeth and/or dentures?: No Does patient usually wear dentures?: No  CIWA:  CIWA-Ar Total: 0 COWS:     Assessment:  Patient is stable and not currently presenting with severe withdrawal. He has had no seizures ( has a history of  a WDL seizure in the past) . He is relatively new to alcohol dependence treatment paradigms, but is expressing motivation and insight regarding the importance of abstinence  after discharge and plans to attend AA. Mood stable. Interested in Campral.  Treatment Plan Summary: Daily contact with patient to assess and evaluate symptoms and progress in treatment Medication management See below  Plan: Continue inpatient treatment Continue to encourage early recovery /relapse prevention efforts Start Campral 666 mgrs TID Continue Ativan detox taper  Medical Decision Making Problem Points:  Established problem, stable/improving (1), Review of last therapy session (1) and Review of psycho-social stressors (1) Data Points:  Review of medication regiment & side effects (2) Review of new medications or change in dosage (2)  I certify that inpatient services furnished can reasonably be expected to improve the patient's condition.   COBOS, FERNANDO 03/17/2014, 10:52 AM

## 2014-03-17 NOTE — [facility] Group Notes (Deleted)
[facility] LCSW Aftercare Discharge Planning Group Note  03/17/2014  8:45 AM  Participation Quality: Did Not Attend.  Samuella Bruin, MSW, [employer] Clinical Social Worker [facility] [phone]

## 2014-03-17 NOTE — [facility] Group Notes (Signed)
[facility] LCSW Group Therapy  Emotional Regulation 1:15 - 2: 30 PM        03/17/2014     Type of Therapy:  Group Therapy  Participation Level:  Appropriate  Participation Quality:  Appropriate  Affect:  Appropriate  Cognitive:  Attentive Appropriate  Insight:  Developing/Improving Engaged  Engagement in Therapy:  Developing/Improving Engaged  Modes of Intervention:  Discussion Exploration Problem-Solving Supportive  Summary of Progress/Problems:  Group topic was emotional regulations.  Patient participated in the discussion and was able to identify an emotion that needed to regulated.  Patient shared he tries hard to balance his emotions. He stated even though he may have several bad days, he knows a good day is coming.  Patient was able to identify approprite coping skills.  Jeremy Pratt 03/17/2014

## 2014-03-17 NOTE — [facility] Group Notes (Signed)
[facility] LCSW Aftercare Discharge Planning Group Note   03/17/2014 10:48 AM    Participation Quality:  Appropraite  Mood/Affect:  Appropriate  Depression Rating:  1  Anxiety Rating:  1  Thoughts of Suicide:  No  Will you contract for safety?   NA  Current AVH:  No  Plan for Discharge/Comments:  Patient attended discharge planning group and actively participated in group.  Patient reports doing well and hopes to discharge soon.  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  Patient is agreeable to follow up with Glendell Docker.  CSW provided all participants with daily workbook.   Transportation Means: Patient has transportation.   Supports:  Patient has a support system.   Cindel Daugherty, Joesph July

## 2014-03-17 NOTE — Progress Notes (Signed)
Adult Psychoeducational Group Note  Date:  03/17/2014 Time:  10:50 AM  Group Topic/Focus:  Personal Choices and Values:   The focus of this group is to help patients assess and explore the importance of values in their lives, how their values affect their decisions, how they express their values and what opposes their expression.  Participation Level:  Active  Participation Quality:  Appropriate, Attentive and Sharing  Affect:  Appropriate  Cognitive:  Alert, Appropriate and Oriented  Insight: Appropriate and Good  Engagement in Group:  Engaged  Modes of Intervention:  Discussion, Education, Socialization and Support  Additional Comments:  Pt attended and participated in group. Pt shared that he "needs to overcome stress and triggers that lead to drinking".  Berlin Hun 03/17/2014, 10:50 AM

## 2014-03-17 NOTE — Progress Notes (Signed)
Pt has been up and active in the milieu today.  He denies any depression, hopelessness or anxiety on his self-inventory. He denies any signs or symptoms of withdrawal.  He stated,"I believe the medications are helping" he denies any S/H ideation or A/V/H.

## 2014-03-17 NOTE — Progress Notes (Signed)
Adult Psychoeducational Group Note  Date:  03/17/2014 Time:  10:46 PM  Group Topic/Focus:  Goals Group:   The focus of this group is to help patients establish daily goals to achieve during treatment and discuss how the patient can incorporate goal setting into their daily lives to aide in recovery.  Participation Level:  Active  Participation Quality:  Appropriate  Affect:  Appropriate  Cognitive:  Appropriate  Insight: Appropriate  Engagement in Group:  Engaged  Modes of Intervention:  Discussion  Additional Comments:  Pt stated he was being DC tomorrow so that makes his day so much better.  Aldona Lento 03/17/2014, 10:46 PM

## 2014-03-17 NOTE — Progress Notes (Addendum)
D:Patient pleasant and cooperative on approach.  Patient states he is positively anxious because he is going home tomorrow.  Patient states he is going to gp to AA meeting regularly when discharged.  Patient ha visitors tonight and he states it was a positive visit.  Patient denies withdrawal symptoms.  Patient denies SI/HI and denies AVH. A: Staff to monitor Q 15 mins for safety.  Encouragement and support offered.  Scheduled medications administered per orders. R: Patient remains safe on the unit.  Patient attended group tonight.  Patient visible on the unit and interacting with peers.  Patient taking administered mediations.

## 2014-03-18 DIAGNOSIS — F329 Major depressive disorder, single episode, unspecified: Secondary | ICD-10-CM

## 2014-03-18 DIAGNOSIS — F1010 Alcohol abuse, uncomplicated: Secondary | ICD-10-CM

## 2014-03-18 MED ORDER — ACAMPROSATE CALCIUM 333 MG PO TBEC: 666.0000 mg | DELAYED_RELEASE_TABLET | Freq: Three times a day (TID) | ORAL | Status: AC

## 2014-03-18 MED ORDER — PANTOPRAZOLE SODIUM 40 MG PO TBEC: 40.0000 mg | DELAYED_RELEASE_TABLET | Freq: Two times a day (BID) | ORAL | Status: DC

## 2014-03-18 MED ORDER — CYANOCOBALAMIN 500 MCG PO TABS: 500.0000 ug | ORAL_TABLET | Freq: Every day | ORAL | Status: DC

## 2014-03-18 MED ORDER — HYDROCHLOROTHIAZIDE 12.5 MG PO CAPS: 12.5000 mg | ORAL_CAPSULE | Freq: Every day | ORAL | Status: DC

## 2014-03-18 MED ORDER — TRAZODONE HCL 50 MG PO TABS: 50.0000 mg | ORAL_TABLET | Freq: Every evening | ORAL | Status: AC | PRN

## 2014-03-18 MED ORDER — AMLODIPINE BESYLATE 5 MG PO TABS: 5.0000 mg | ORAL_TABLET | Freq: Every day | ORAL | Status: DC

## 2014-03-18 NOTE — Progress Notes (Signed)
Adult Psychoeducational Group Note  Date:  03/18/2014 Time:  10:00am Group Topic/Focus:  Making Healthy Choices:   The focus of this group is to help patients identify negative/unhealthy choices they were using prior to admission and identify positive/healthier coping strategies to replace them upon discharge.  Participation Level:  Active  Participation Quality:  Appropriate and Attentive  Affect:  Appropriate  Cognitive:  Alert and Appropriate  Insight: Appropriate  Engagement in Group:  Engaged  Modes of Intervention:  Discussion and Education  Additional Comments:  Patient attended and participated in group. Discussion was on making lifestyle changes. Question was asked What is one positive change you can make to improve your life and current situation. 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  Shelly Bombard D 03/18/2014, 11:19 AM

## 2014-03-18 NOTE — Progress Notes (Signed)
The focus of this group is to educate the patient on the purpose and policies of crisis stabilization and provide a format to answer questions about their admission.  The group details unit policies and expectations of patients while admitted.  Patient attended 0900 nurse education orientation group this morning.  Patient actively participated, appropriate affect, alert, appropriate insight and engagement.  Today patient will work on 3 goals for discharge.  

## 2014-03-18 NOTE — Discharge Summary (Signed)
Physician Discharge Summary Note  Patient:  Jeremy Pratt Current is an 43 y.o., male MRN:  [id number] DOB:  06-23-1971 Patient phone:  [phone] (home)  Patient address:   [address] [place] [address],  Total Time spent with patient: 30 minutes  Date of Admission:  03/15/2014 Date of Discharge: 03/18/2014  Reason for Admission:  Jeremy Pratt is a 43 year old man with a history of alcohol dependence. He states he was a social drinker for years, but over the last 6 months has progressed to daily consumption of up to one fifth of liquor. He endorses morning drinking, hiding his drinking from wife and associates. He last drank two days ago. BAL was 362 upon admission. He does have a history of some significant withdrawal in the past, to include one withdrawal related seizure several months ago.  Discharge Evaluation: Pt notes that since his admission he has learned to identify stressors early and figure out ways to reduce stressors. He also is aware of available resources and their contact information. While here he attended 2 AA groups, and he is interested in locating the local chapter and chapter in [place] where he also resides. 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  Currently rates his depression 0/10, anxiety 0/10, hopelessness 0/10, and denies SI/HI/AVH at this time. He is being discharged home and his wife will be here to pick him. He has an appointment on Monday with "Micheal McMillian".   Discharge Diagnoses: Active Problems:   Alcohol dependence   Psychiatric Specialty Exam: Physical Exam  Constitutional: He is oriented to person, place, and time. He appears well-developed.  Musculoskeletal: Normal range of motion.  Neurological: He is alert and oriented to person, place, and time.  Skin:  Skin is warm.  Psychiatric: He has a normal mood and affect. His behavior is normal. Judgment and thought content normal.    Review of Systems  Psychiatric/Behavioral: Positive for substance abuse (condition is stable). Negative for depression, suicidal ideas and hallucinations. The patient is not nervous/anxious and does not have insomnia.   All other systems reviewed and are negative.   Blood pressure 120/82, pulse 93, temperature 97.1 F (36.2 C), temperature source Oral, resp. rate 18, height  (1.981 m), weight 109.317 kg (241 lb), SpO2 98.00%.Body mass index is 27.86 kg/(m^2).  General Appearance: Well Groomed  Patent attorney::  Fair  Speech:  Clear and Coherent and Normal Rate  Volume:  Normal  Mood:  Euthymic  Affect:  Appropriate and Congruent  Thought Process:  Goal Directed and Intact  Orientation:  Full (Time, Place, and Person)  Thought Content:  WDL  Suicidal Thoughts:  No  Homicidal Thoughts:  No  Memory:  Immediate;   Good Recent;   Good Remote;   Fair  Judgement:  Fair  Insight:  Fair  Psychomotor Activity:  Negative  Concentration:  Good  Recall:  Good  Fund of Knowledge:Good  Language: Good  Akathisia:  No  Handed:  Right  AIMS (if indicated):     Assets:  Communication Skills Desire for Improvement Financial Resources/Insurance Housing Physical Health Social Support Transportation Vocational/Educational  Sleep:  Number of Hours: 3.25    Past Psychiatric History:  Diagnosis: Denies psychiatric history other than alcohol dependence. 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 Hypertension   . Gout   . History of concussion 01/05/2014   AXIS IV:  other psychosocial or environmental problems, problems related to social environment, problems with access to health care services and problems with primary support group AXIS V:  51-60 moderate symptoms  Level of Care:  OP  Hospital Course: 1. Pt was admitted for crisis management and stabilization.  2. Medication management to reduce symptoms to baseline and improved the patient's overall level of functioning. Closely monitor the side effects, efficacy and therapeutic response of medication. Pt was placed on Campral and Ativan protocol.  3. Developed treatment plan to decrease the risk of relapse upon discharge and to reduce the need for readmission.  4. Psychosocial education regarding relapse prevention in self-care.  5. Healthcare followup as needed for medical problems and called consults as indicated.  6. Increase collateral information.  7. Encouraged to participate and verbalize into group milieu therapy.   Consults:  psychiatry  Significant Diagnostic Studies:  None  Discharge Vitals:   Blood pressure 120/82, pulse 93, temperature 97.1 F (36.2 C), temperature source Oral, resp. rate 18, height  (1.981 m), weight 109.317 kg (241 lb), SpO2 98.00%. Body mass index is 27.86 kg/(m^2). Lab Results:   No results found for this or any previous visit (from the past 72 hour(s)).  Physical Findings: AIMS: Facial and Oral Movements Muscles of Facial Expression: None, normal Lips and Perioral Area:  None, normal Jaw: None, normal Tongue: None, normal,Extremity Movements Upper (arms, wrists, hands, fingers): None, normal Lower (legs, knees, ankles, toes): None, normal, Trunk Movements Neck, shoulders, hips: None, normal, Overall Severity Severity of abnormal movements (highest score from questions above): None, normal Incapacitation due to abnormal movements: None, normal Patient's awareness of abnormal movements (rate only patient's report): No Awareness, Dental Status Current problems with teeth and/or dentures?: No Does patient usually wear dentures?: No  CIWA:  CIWA-Ar Total: 1 COWS:  COWS Total Score: 1  Psychiatric Specialty Exam: See Psychiatric Specialty Exam and Suicide Risk Assessment completed by Attending Physician prior to discharge.  Discharge destination:  Home  Is patient on multiple antipsychotic therapies at discharge:  No   Has Patient had three or more failed trials of antipsychotic monotherapy by history:  No  Recommended Plan for Multiple Antipsychotic Therapies: NA     Medication List    STOP taking these medications       hydrOXYzine 50 MG capsule  Commonly known as:  VISTARIL     PARoxetine 20 MG tablet  Commonly known as:  PAXIL      TAKE these medications     Indication   acamprosate 333 MG tablet  Commonly known as:  CAMPRAL  Take 2 tablets (666 mg total) by mouth 3 (three) times daily with meals.   Indication:  Excessive Use  of Alcohol     bismuth subsalicylate 262 MG/15ML suspension  Commonly known as:  PEPTO BISMOL  Take 30 mLs by mouth every 6 (six) hours as needed for indigestion or diarrhea or loose stools.      calcium carbonate 500 MG chewable tablet  Commonly known as:  TUMS - dosed in mg elemental calcium  Chew 2 tablets by mouth 2 (two) times daily.      colchicine 0.6 MG tablet  Take 0.6 mg by mouth daily.      ferrous sulfate 325 (65 FE) MG tablet  Take 325 mg by mouth daily with breakfast.      multivitamin with  minerals tablet  Take 1 tablet by mouth daily.      omeprazole 20 MG capsule  Commonly known as:  PRILOSEC  Take 20 mg by mouth 2 (two) times daily before a meal.      psyllium 95 % Pack  Commonly known as:  HYDROCIL/METAMUCIL  Take 1 packet by mouth daily.      traZODone 50 MG tablet  Commonly known as:  DESYREL  Take 1 tablet (50 mg total) by mouth at bedtime as needed for sleep.   Indication:  Trouble Sleeping     TRIBENZOR 40-5-12.5 MG Tabs  Generic drug:  Olmesartan-Amlodipine-HCTZ  Take 1 tablet by mouth every morning.      ursodiol 300 MG capsule  Commonly known as:  ACTIGALL  Take 300 mg by mouth 2 (two) times daily.      vitamin B-12 500 MCG tablet  Commonly known as:  CYANOCOBALAMIN  Take 500 mcg by mouth daily.      Vitamin D3 2000 UNITS Tabs  Take 1 tablet by mouth daily.            Follow-up Information   Follow up with Glendell Docker, MS LCAS< CCS, LPC On 03/30/2014. (Tuesday, March 30, 2014 at 1:00 PM)    Contact information:   [address] [place], [place]   [address]  [phone]      Follow-up recommendations:  Activity:  Resume normal activity at this time.  Diet:  Regular house diet Tests:  None needed at this time Other:  Please keep all scheduled appointments and inquire about local AA chapter meetings. He is encouraged to follow up with PCP for continuation of current medications.   Comments:  Please continue to take medications as directed. If your symptoms return, worsen, or persist please call your 911, report to local ER, or contact crisis hotline. Please do not drink alcohol or use any illegal substances while taking prescription medications.   Total Discharge Time:  Less than 30 minutes.  Signed: Truman Hayward FNP-BC 03/18/2014, 1:13 PM  Patient seen, Suicide Assessment Completed.  Disposition Plan Reviewed

## 2014-03-18 NOTE — [facility] Suicide Risk Assessment (Addendum)
Demographic Factors:  43 year old man, married, has two children, self employed  Total Time spent with patient: 30 minutes  Psychiatric Specialty Exam: Physical Exam  ROS  Blood pressure 120/82, pulse 93, temperature 97.1 F (36.2 C), temperature source Oral, resp. rate 18, height  (1.981 m), weight 109.317 kg (241 lb), SpO2 98.00%.Body mass index is 27.86 kg/(m^2).  General Appearance: Well Groomed  Patent attorney::  Good  Speech:  Normal Rate  Volume:  Normal  Mood:  Euthymic  Affect:  Appropriate  Thought Process:  Goal Directed and Linear  Orientation:  Full (Time, Place, and Person)  Thought Content:  no hallucinations,no delusions  Suicidal Thoughts:  No- denies any suicidal or homicidal ideations  Homicidal Thoughts:  No  Memory:  NA  Judgement:  Good  Insight:  Fair  Psychomotor Activity:  Normal  Concentration:  Good  Recall:  Good  Fund of Knowledge:Good  Language: Good  Akathisia:  Negative  Handed:  Right  AIMS (if indicated):     Assets:  Communication Skills Desire for Improvement Financial Resources/Insurance Physical Health Resilience  Sleep:  Number of Hours: 3.25    Musculoskeletal: Strength & Muscle Tone: within normal limits Gait & Station: normal Patient leans: N/A   Mental Status Per Nursing Assessment::   On Admission:     Current Mental Status by Physician: As noted, at this time patient is alert , attentive, pleasant, calm, mood is euthymic, affect is appropriate, no thought disorder, no SI or HI, no psychotic symptoms. Future oriented. He is not presenting with any withdrawal symptoms.  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  There are no identifiable plans, no associated intent, mild dysphoria and related symptoms, good self-control (both objective and subjective assessment), few other risk factors, and identifiable protective factors, including available and accessible social support.  Discharge Diagnoses:   AXIS I:  Alcohol Dependence, status post Alcohol Withdrawal AXIS II:  Deferred AXIS III:   Past Medical History  Diagnosis Date  . Hypertension   . Gout   . History of concussion 01/05/2014   AXIS IV:  occupational problems- stressful work AXIS V:  61-70 mild symptoms  Plan Of Care/Follow-up recommendations:  Activity:  See below Diet:  Heart Healthy, low sodium Tests:  NA Other:  See below  Is patient on multiple antipsychotic therapies at discharge:  No   Has Patient had three or more failed trials of antipsychotic monotherapy by history:  No  Recommended Plan for Multiple Antipsychotic Therapies: NA  Patient is being discharged in good spirits. He is returning home. He plans to continue outpatient therapy. He plans to follow up with his PCP, Dr. Mariah Milling in [place] ( patient  Lives between  There and [facility]  Intermittently due to work responsibilities) , and ask her to continue managing Campral prescription. 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   COBOS, FERNANDO 03/18/2014, 1:24 PM

## 2014-03-18 NOTE — [facility] Suicide Risk Assessment (Signed)
[facility] INPATIENT:  Family/Significant Other Suicide Prevention Education  Suicide Prevention Education:  Patient Refusal for Family/Significant Other Suicide Prevention Education: The patient Jeremy Pratt has refused to provide written consent for family/significant other to be provided Family/Significant Other Suicide Prevention Education during admission and/or prior to discharge.  Physician notified.  Patient was not endorsing suicide on admission nor during hospitalization.  NO SPE needed.  Wynn Banker 03/18/2014, 4:30 PM

## 2014-03-18 NOTE — Progress Notes (Signed)
D:  Patient's self inventory sheet, patient has poor sleep, sleep medication does help him.  Good appetite, high energy level, good concentration.  Denied hopeless, anxiety and depression.  Continues to experience withdrawals.  Denied SI.  Denied physical problems.   Plans to discharge today.  No problems taking medications after discharge. A:  Medications administered per MD orders.  Emotional support and encouragement given patient. R:  Denied SI and HI.  Denied A/V hallucinations.  Safety maintained with 15 minute checks.

## 2014-03-18 NOTE — Progress Notes (Signed)
[facility] Adult Case Management Discharge Plan :  Will you be returning to the same living situation after discharge: Yes,  Patient returning to his home. At discharge, do you have transportation home?:Yes,  Patient arranged transportation home. Do you have the ability to pay for your medications: yes, patient able to obtain medications.  Release of information consent forms completed and in the chart;  Patient's signature needed at discharge.  Patient to Follow up at: Follow-up Information   Follow up with Glendell Docker, MS LCAS, CCS, LPC On 03/30/2014. (Tuesday, March 30, 2014 at 1:00 PM)    Contact information:   [address] [place], [place]   [address]  [phone]      Follow up with Mariah Milling, MD. (Patient will contact PCP for recommendation for mediation managment by a psychiatrist)    Contact information:   [address] [place], [place]  [address]  [phone]      Patient denies SI/HI:  Patient did not endorse SI/HI or other thoughts of self harm on admission or during hospitalization.   Safety Planning and Suicide Prevention discussed:  .Reviewed with all patients during discharge planning group   Lessie Funderburke, [place] July 03/18/2014, 4:27 PM

## 2014-03-18 NOTE — Progress Notes (Signed)
Discharge Note:  Patient discharged home with his wife.  Denied SI and HI.  Denied A/V hallucinations.  Suicide prevention information given and discussed with patient who stated he understood and had no questions.  Patient stated he received all his belongings, clothing, misc items, toiletries, prescriptions, medications.  Patient stated he appreciated all assistance received from [facility] staff.

## 2014-03-22 NOTE — Progress Notes (Addendum)
Patient Discharge Instructions:  After Visit Summary (AVS):   Faxed to:  03/22/14 Discharge Summary Note:   Faxed to:  03/22/14 Psychiatric Admission Assessment Note:   Faxed to:  03/22/14 Suicide Risk Assessment - Discharge Assessment:   Faxed to:  03/22/14 Faxed/Sent to the Next Level Care provider:  03/22/14 Faxed to Glendell Docker @ [phone] Faxed to Mariah Milling, MD @ [phone]  Jerelene Redden, 03/22/2014, 3:42 PM

## 2014-03-23 NOTE — ED Provider Notes (Signed)
Medical screening examination/treatment/procedure(s) were performed by non-physician practitioner and as supervising physician I was immediately available for consultation/collaboration.   EKG Interpretation None       Jaisean Monteforte M Kortez Murtagh, MD 03/23/14 2045 

## 2014-06-11 ENCOUNTER — Ambulatory Visit: Payer: BC Managed Care – PPO | Admitting: Neurology

## 2016-04-23 IMAGING — CT CT HEAD W/O CM
2 series · 15 of 30 positions shown, 19 images · non-contrast
Comparison: None.

ADDENDUM:
Voiced recognition air noted in the impression. The second sentence
of the impression should read as follows "MRI/MRA of the brain would
be helpful to further evaluate ."
CLINICAL DATA: Mental status changes.

EXAM:
CT HEAD WITHOUT CONTRAST
TECHNIQUE: Contiguous axial images were obtained from the base of the skull
through the vertex without intravenous contrast.

[Series 2: head w/o · axial · non-contrast · 0.49mm/px · z∈[-111,+14]mm · 13 of 31 slices shown, 17 images]
[im 3/31  brain]
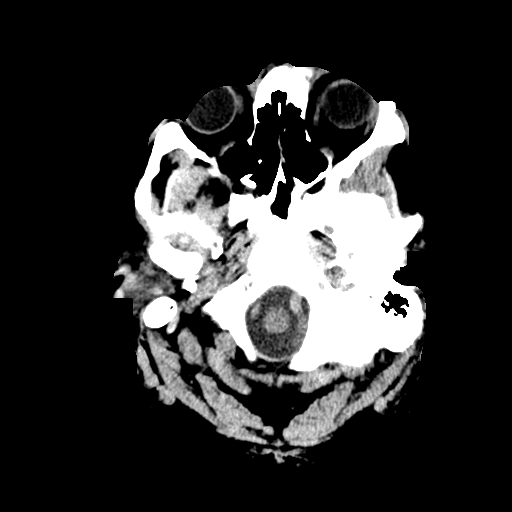
[im 3/31  bone]
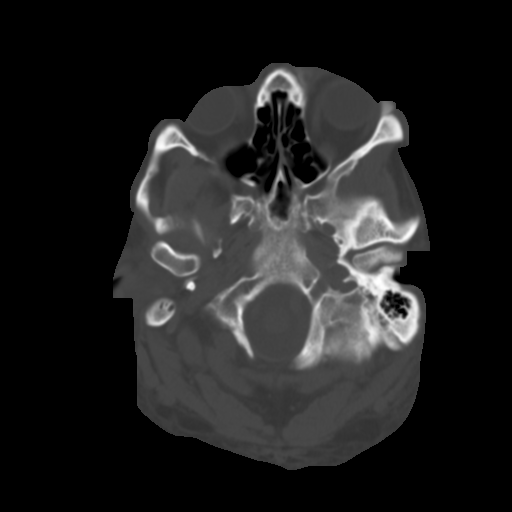
[im 5/31  brain]
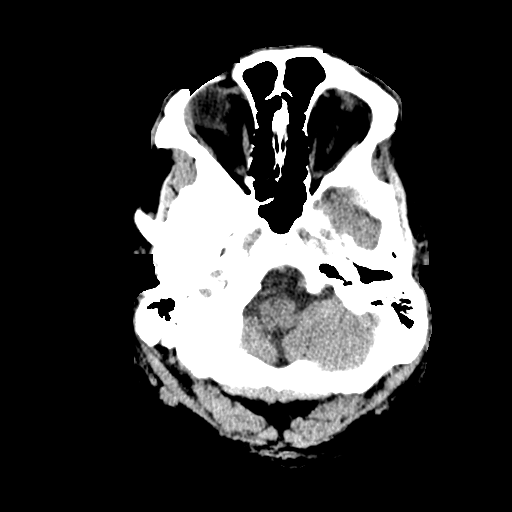
[im 7/31  brain]
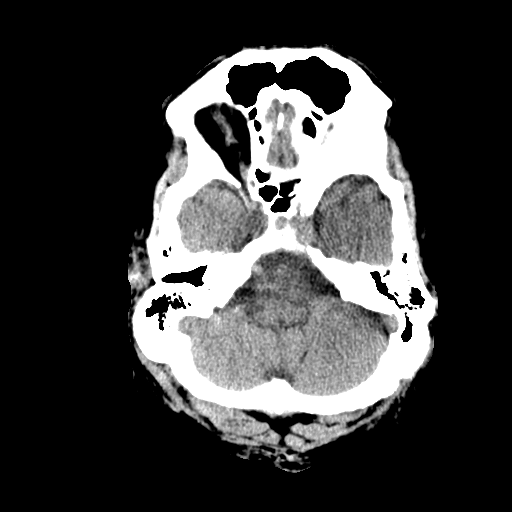
[im 9/31  brain]
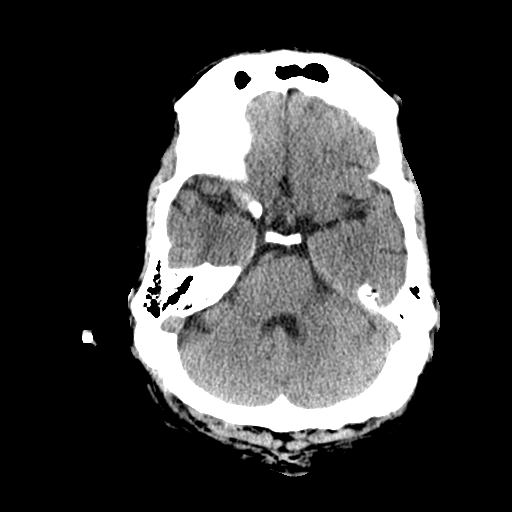
[im 11/31  brain]
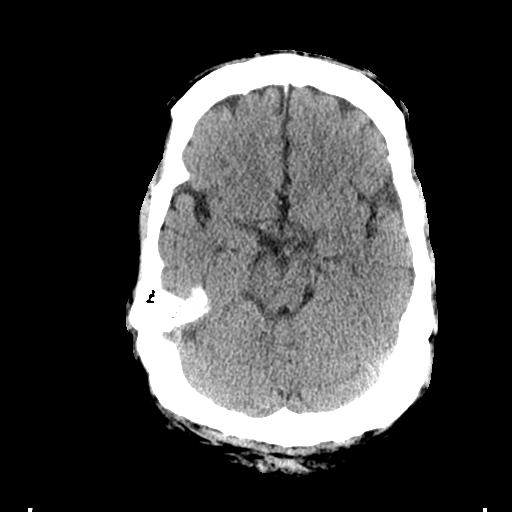
[im 11/31  bone]
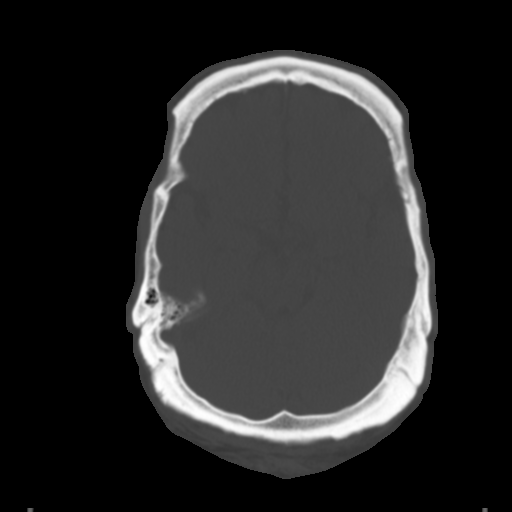
[im 13/31  brain]
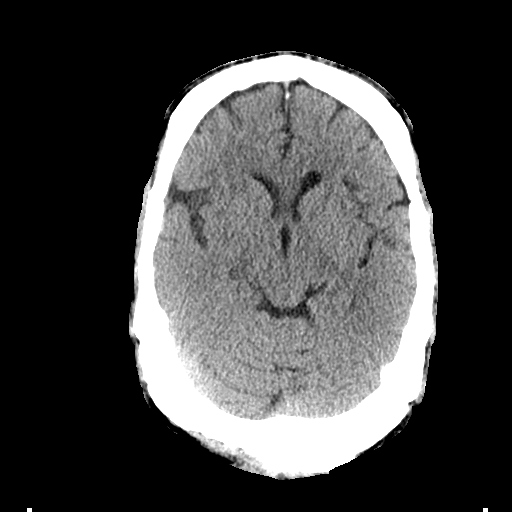
[im 16/31  brain]
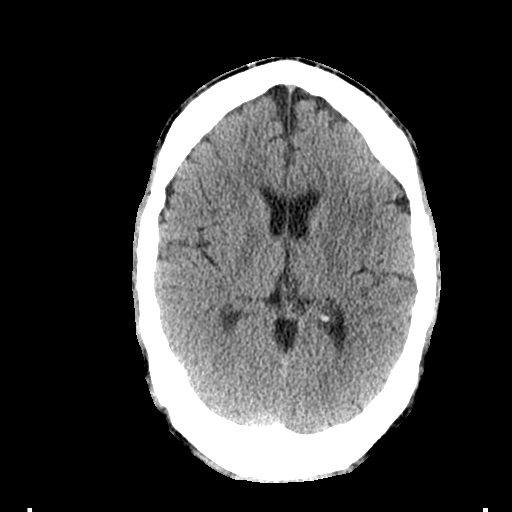
[im 18/31  brain]
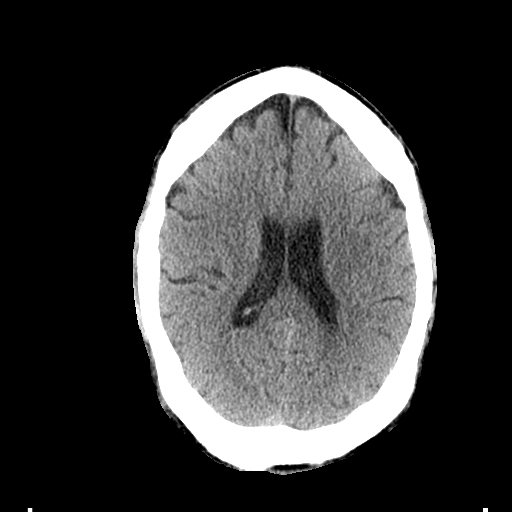
[im 20/31  brain]
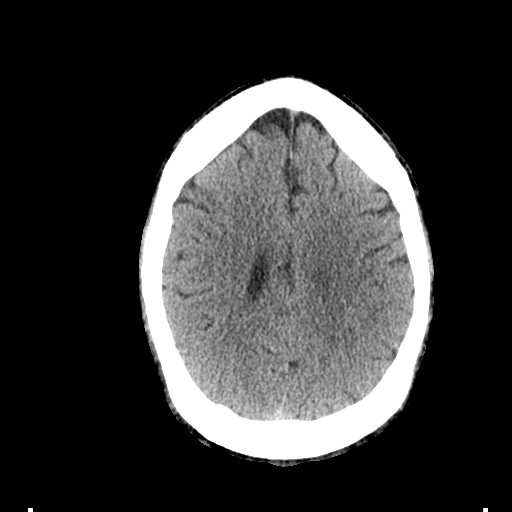
[im 20/31  bone]
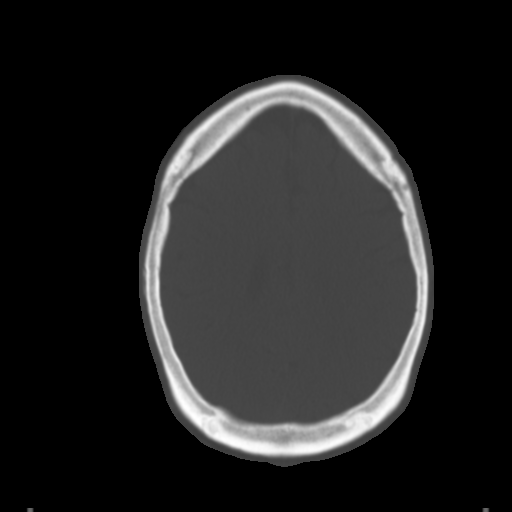
[im 22/31  brain]
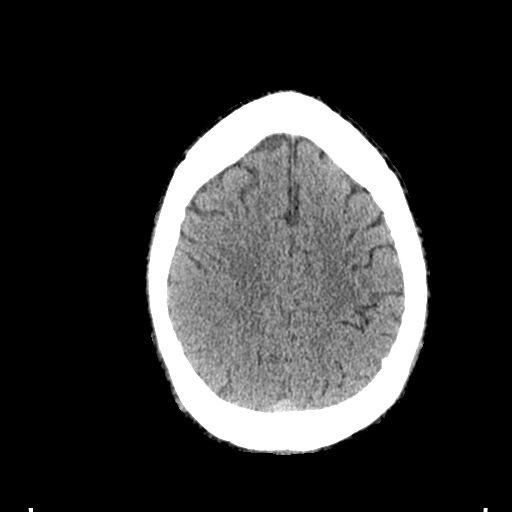
[im 24/31  brain]
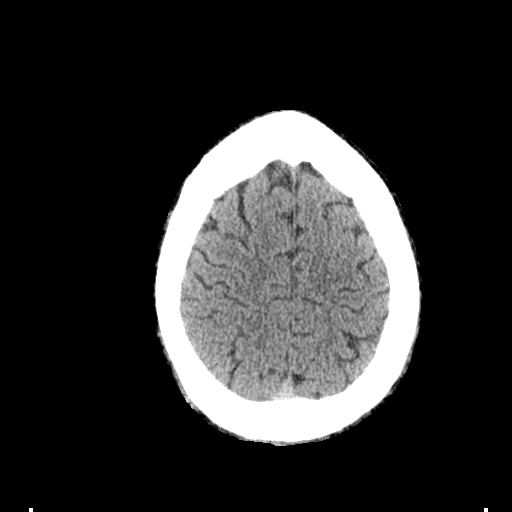
[im 26/31  brain]
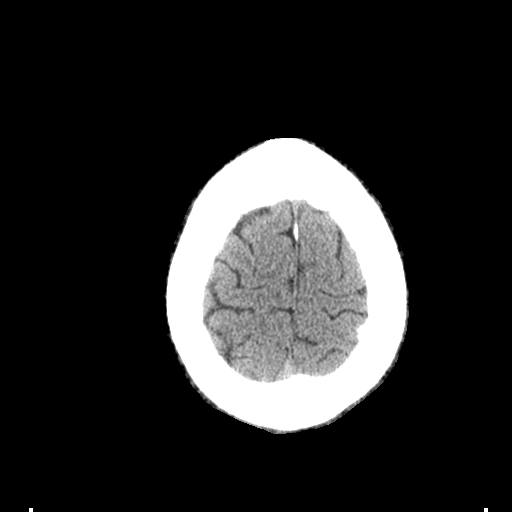
[im 28/31  brain]
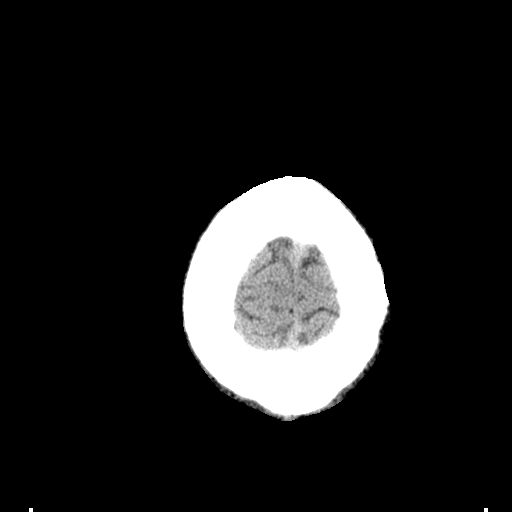
[im 28/31  bone]
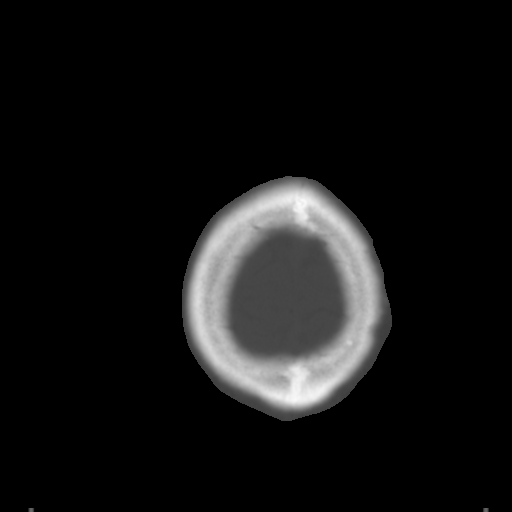

[Series 3: bone windows · axial · 0.49mm/px · z∈[-111,-91]mm · 2 of 31 slices shown]
[im 3/31  bone]
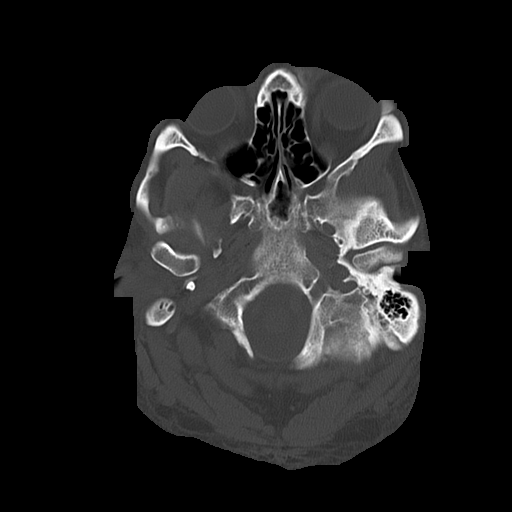
[im 7/31  bone]
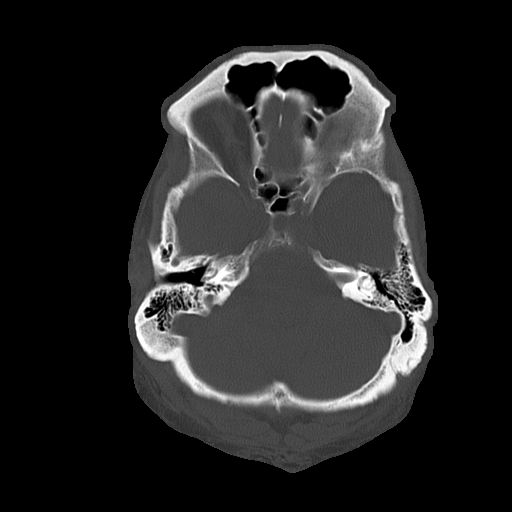

[15 of 30 positions shown; findings below may reference images not displayed]

FINDINGS: There is no evidence for acute hemorrhage, hydrocephalus, mass
lesion, or abnormal extra-axial fluid collection. No definite CT
evidence for acute infarction. In the limb region of the left distal
ICA, there is some soft tissue fullness and bony irregularity
associated with the left side of the sella turcica. These changes
may be related to volume-averaging, by mass lesion or aneurysm
cannot be excluded.

The visualized paranasal sinuses and mastoid air cells are clear.
IMPRESSION: Question aneurysm in the region of the distal left ICA versus
extra-axial abnormal soft tissue (see image 7 series 2). MRI/MRI of
the brain would be helpful to further evaluate.

## 2016-12-21 DEATH — deceased
